# Patient Record
Sex: Female | Born: 1955 | Race: White | Hispanic: No | Marital: Married | Smoking: Former smoker
Health system: Southern US, Community
[De-identification: ages and names within clinical notes are randomized; demographics above are authoritative.]

## PROBLEM LIST (undated history)

## (undated) DIAGNOSIS — H9319 Tinnitus, unspecified ear: Secondary | ICD-10-CM

## (undated) DIAGNOSIS — C50919 Malignant neoplasm of unspecified site of unspecified female breast: Secondary | ICD-10-CM

## (undated) DIAGNOSIS — H9190 Unspecified hearing loss, unspecified ear: Secondary | ICD-10-CM

## (undated) DIAGNOSIS — A630 Anogenital (venereal) warts: Secondary | ICD-10-CM

## (undated) DIAGNOSIS — R002 Palpitations: Secondary | ICD-10-CM

## (undated) DIAGNOSIS — U071 COVID-19: Secondary | ICD-10-CM

## (undated) HISTORY — PX: COSMETIC SURGERY: SHX468

## (undated) HISTORY — PX: TUBAL LIGATION: SHX77

## (undated) HISTORY — DX: Palpitations: R00.2

## (undated) HISTORY — DX: Anogenital (venereal) warts: A63.0

## (undated) HISTORY — DX: Unspecified hearing loss, unspecified ear: H91.90

## (undated) HISTORY — DX: Malignant neoplasm of unspecified site of unspecified female breast: C50.919

## (undated) HISTORY — DX: Tinnitus, unspecified ear: H93.19

## (undated) HISTORY — DX: COVID-19: U07.1

---
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
## 2017-03-02 ENCOUNTER — Ambulatory Visit (INDEPENDENT_AMBULATORY_CARE_PROVIDER_SITE_OTHER): Payer: 59 | Admitting: Obstetrics and Gynecology

## 2017-03-02 ENCOUNTER — Other Ambulatory Visit (HOSPITAL_COMMUNITY)
Admission: RE | Admit: 2017-03-02 | Discharge: 2017-03-02 | Disposition: A | Discharge status: Home / Self Care | Payer: Managed Care, Other (non HMO) | Source: Ambulatory Visit | Attending: Obstetrics and Gynecology | Admitting: Obstetrics and Gynecology

## 2017-03-02 ENCOUNTER — Encounter: Payer: Self-pay | Admitting: Obstetrics and Gynecology

## 2017-03-02 VITALS — BP 102/62 | HR 72 | Resp 16 | Ht 63.75 in | Wt 149.0 lb

## 2017-03-02 DIAGNOSIS — Z23 Encounter for immunization: Secondary | ICD-10-CM

## 2017-03-02 DIAGNOSIS — N950 Postmenopausal bleeding: Secondary | ICD-10-CM | POA: Diagnosis not present

## 2017-03-02 DIAGNOSIS — Z01419 Encounter for gynecological examination (general) (routine) without abnormal findings: Secondary | ICD-10-CM | POA: Diagnosis not present

## 2017-03-02 DIAGNOSIS — Z79890 Hormone replacement therapy (postmenopausal): Secondary | ICD-10-CM

## 2017-03-02 DIAGNOSIS — Z124 Encounter for screening for malignant neoplasm of cervix: Secondary | ICD-10-CM | POA: Insufficient documentation

## 2017-03-02 MED ORDER — MISOPROSTOL 200 MCG PO TABS: ORAL_TABLET | ORAL | 0 refills | Status: DC

## 2017-03-02 NOTE — Progress Notes (Signed)
61 y.o. [id number] MarriedCaucasianF here for annual exam.  She denies vaginal bleeding. Sexually active no pain.  No vasomotor symptoms. She is on estrogen and testosterone pellets for the last few years. She notices an increase in energy, libido, weight loss with the testosterone. She has gotten cystic acne from the testosterone.     No LMP recorded (lmp unknown). Patient is postmenopausal.          Sexually active: Yes.    The current method of family planning is post menopausal status.    Exercising: Yes.    tennis, golf, walking Smoker:  Former smoker years ago  Health Maintenance: Pap:  2016 per patient done with Dr. Louretta Shorten in [place] -- normal per patient History of abnormal Pap:  no MMG:  05/26/16 BIRADS 1 negative -- in Care Everywhere  Colonoscopy:  Done with [facility] early 2018 - normal per patient -- repeat in 5 years BMD:   Few years ago per patient Osteopenia TDaP:  unsure Gardasil: n/a   reports that she has quit smoking. Her smoking use included Cigarettes. She has never used smokeless tobacco. She reports that she drinks about 2.4 - 3.0 oz of alcohol per week . She reports that she does not use drugs.She has daughter who is 72, in [place], Anesthesia resident.  Retired.   Past Medical History:  Diagnosis Date  . Genital warts     Past Surgical History:  Procedure Laterality Date  . CESAREAN SECTION    . COSMETIC SURGERY    . [place]  . TUBAL LIGATION      Current Outpatient Prescriptions  Medication Sig Dispense Refill  . Multiple Vitamin (MULTIVITAMIN) tablet Take 1 tablet by mouth daily.    . Naproxen Sodium 220 MG CAPS Take by mouth as needed.    . NON FORMULARY Estrogen pllt; one implant every 3 months    . Omega-3 Fatty Acids (OMEGA-3 FISH OIL PO) Take by mouth daily.    Marland Kitchen PROGESTERONE MICRONIZED PO Take 100 mg by mouth daily. Take 150mg  po daily    . spironolactone (ALDACTONE) 50 MG tablet Take 50 mg by mouth  daily.    . Testosterone 75 MG PLLT by Implant route every 3 (three) months.     No current facility-administered medications for this visit.     Family History  Problem Relation Age of Onset  . Arthritis Father     Review of Systems  Constitutional: Negative.   HENT: Negative.   Eyes: Negative.   Respiratory: Negative.   Cardiovascular: Negative.   Gastrointestinal: Negative.   Endocrine: Negative.   Genitourinary: Negative.   Musculoskeletal: Negative.   Skin: Negative.   Allergic/Immunologic: Negative.   Neurological: Negative.   Hematological: Negative.   Psychiatric/Behavioral: Negative.     Exam:   BP 102/62 (BP Location: Right Arm, Patient Position: Sitting, Cuff Size: Normal)   Pulse 72   Resp 16   Ht 5' 3.75" (1.619 m)   Wt 149 lb (67.6 kg)   LMP  (LMP Unknown)   BMI 25.78 kg/m   Weight change: @WEIGHTCHANGE @ Height:   Height: 5' 3.75" (161.9 cm)  Ht Readings from Last 3 Encounters:  03/02/17 5' 3.75" (1.619 m)    General appearance: alert, cooperative and appears stated age Head: Normocephalic, without obvious abnormality, atraumatic Neck: no adenopathy, supple, symmetrical, trachea midline and thyroid normal to inspection and palpation Lungs: clear to auscultation bilaterally Cardiovascular: regular rate and rhythm Breasts: normal appearance,  no masses or tenderness Abdomen: soft, non-tender; bowel sounds normal; no masses,  no organomegaly Extremities: extremities normal, atraumatic, no cyanosis or edema Skin: Skin color, texture, turgor normal. No rashes or lesions Lymph nodes: Cervical, supraclavicular, and axillary nodes normal. No abnormal inguinal nodes palpated Neurologic: Grossly normal   Pelvic: External genitalia:  no lesions              Urethra:  normal appearing urethra with no masses, tenderness or lesions              Bartholins and Skenes: normal                 Vagina: normal appearing vagina with a brown d/c (hemacult +)               Cervix: no lesions, stenotic               Bimanual Exam:  Uterus:  normal size, contour, position, consistency, mobility, non-tender              Adnexa: no mass, fullness, tenderness               Rectovaginal: Confirms               Anus:  normal sphincter tone, no lesions  Chaperone was present for exam.  A:  Well Woman with normal exam  On HRT estrogen and testosterone pellets and prometrium. Needs spironolactone for the cystic acne caused from the pellets.   PMP bleeding. Brown d/c in her vagina. On questioning she c/o a several day history brown d/c    P:   Pap with hpv  Labs with primary, needs to establish care  Return for gyn ultrasound, possible sonohysterogram, possible endometrial biopsy  Pre-treat with cytotec  Discussed my concerns with hormone pellets, she only gets levels checked every 3 months (prior to getting another pellet). Discussed risks of HRT in general and testosterone. I'm concerned with her cystic acne that her testosterone levels are too high (she declines blood work today)  Discussed that there is no FDA approved testosterone for women, discussed risk of having testosterone levels outside of the range for a normal female. Discussed the risks of unopposed estrogen (she is on prometrium) and again the concerns with pellets.   After her exam she states that in the last year her estrogen pellet dose was increased, she had some bleeding and they decreased her dose. No evaluation was done.

## 2017-03-02 NOTE — Patient Instructions (Signed)

## 2017-03-04 ENCOUNTER — Telehealth: Payer: Self-pay | Admitting: Obstetrics and Gynecology

## 2017-03-04 NOTE — Telephone Encounter (Signed)
Called patient to review benefits for Sonohystogram and Endometrial Biopsy. Left voicemail to call back and review.

## 2017-03-07 LAB — CYTOLOGY - PAP
DIAGNOSIS: NEGATIVE
HPV (WINDOPATH): NOT DETECTED

## 2017-03-07 NOTE — Telephone Encounter (Signed)
Patient returned call. Reviewed benefit for all procedures. Offered next available appointment. Patient declined. Patient requested to schedule 04-05-17. Patient scheduled with Dr. Talbert Nan @ 1pm on 04-05-17.   Patient agreeable to return call from nurse if changes required to appointment.  Routing to provider for review.

## 2017-03-16 ENCOUNTER — Other Ambulatory Visit: Payer: Self-pay | Admitting: *Deleted

## 2017-03-16 DIAGNOSIS — N950 Postmenopausal bleeding: Secondary | ICD-10-CM

## 2017-03-31 ENCOUNTER — Telehealth: Payer: Self-pay | Admitting: Obstetrics and Gynecology

## 2017-03-31 NOTE — Telephone Encounter (Signed)
Spoke with patient. SHGM and possible EMB rescheduled to 04/26/2017 at 1 pm with 1:30 pm consult with Dr.Jertson. Patient declines earlier appointments.  Routing to provider for final review. Patient agreeable to disposition. Will close encounter.

## 2017-03-31 NOTE — Telephone Encounter (Signed)
Patient called to cancel/reschedule her ultrasound for 04/05/17 due to incoming inclement weather. Routing call to [facility] for rescheduling.

## 2017-04-05 ENCOUNTER — Other Ambulatory Visit: Payer: 59 | Admitting: Obstetrics and Gynecology

## 2017-04-05 ENCOUNTER — Other Ambulatory Visit: Payer: 59

## 2017-04-26 ENCOUNTER — Telehealth: Payer: Self-pay | Admitting: Obstetrics and Gynecology

## 2017-04-26 ENCOUNTER — Ambulatory Visit: Payer: 59

## 2017-04-26 ENCOUNTER — Other Ambulatory Visit: Payer: 59 | Admitting: Obstetrics and Gynecology

## 2017-04-26 NOTE — Telephone Encounter (Addendum)
Patient came in for an ultrasound (SHGM/EMB) today but cancelled due to her wait time. She said she will call back to reschedule with her schedule. The patient said she will also need a refill on the medication she took today prior to her appointment.  Routing to triage, Deloris Ping and Dr. Talbert Nan

## 2017-05-05 MED ORDER — MISOPROSTOL 200 MCG PO TABS: ORAL_TABLET | ORAL | 0 refills | Status: DC

## 2017-05-05 NOTE — Telephone Encounter (Signed)
Spoke with patient. SHGM with possible EMB rescheduled to 06/02/2017 at 9:30 am with 10 am consult with Dr.Jertson. Patient declines all earlier appointments offered due to her schedule. Rx for Cytotec 200 mcg place 2 tablets 6-12 hours prior to appointment #2 0RF sent to pharmacy on file. Patient verbalizes understanding.  Routing to provider for final review. Patient agreeable to disposition. Will close encounter.

## 2017-05-31 ENCOUNTER — Telehealth: Payer: Self-pay | Admitting: Obstetrics and Gynecology

## 2017-05-31 NOTE — Telephone Encounter (Signed)
Patient called on 05/30/17 to verify benefits for appointment scheduled on 06/02/17.  I advised patient, benefits were pre-certified in August and I would like to call to pre-cert again, since it has been  over three months.  Patient is agreeable.  Call placed to patient to review re-verified benefits for scheduled appointment.  Left voicemail message requesting a return call to review information.    cc: Lamont Snowball, RN

## 2017-05-31 NOTE — Telephone Encounter (Signed)
Patient returned call. Provided benefit information for scheduled appointment on 06/02/17. Patient understood information presented. Patient requested to cancel appointment, stating she would like to "hold off until the first of the year".  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  Advised patient I will advise her doctor of her decision.  Routing to Dr Talbert Nan  cc: Lamont Snowball, RN

## 2017-06-01 NOTE — Telephone Encounter (Signed)
Please speak with the patient. 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 Even if she can't go through with the ultrasound yet, see if she is willing to come in for the endometrial biopsy. See if there is any way we can help her. Thanks.

## 2017-06-02 ENCOUNTER — Other Ambulatory Visit: Payer: 59 | Admitting: Obstetrics and Gynecology

## 2017-06-02 ENCOUNTER — Other Ambulatory Visit: Payer: 59

## 2017-06-14 NOTE — Telephone Encounter (Signed)
Call to patient. Per DPR,can leave message on voice mail and message has number confirmation.  Left message calling to review importance of proceeding with recommended evaluation. Left message to call back.

## 2017-06-20 NOTE — Telephone Encounter (Signed)
Call to patient. Left message on voice mail (ok per ROI) that we are calling to discuss options regarding evaluation of PMB. Advised Dr Talbert Nan encourages her to follow-thru with evaluation. Can come back in to discuss if needed, could start with endo biopsy. Requested call back to nursing supervisor, Gay Filler, to discuss options.

## 2017-06-21 NOTE — Telephone Encounter (Signed)
Call to patient X 2 and detailed message left.  No patient response. Please advise.

## 2017-06-22 NOTE — Telephone Encounter (Signed)
Please send a letter and then close the encounter

## 2017-06-23 NOTE — Telephone Encounter (Signed)
Patient returned call. Advised Dr Talbert Nan highly encourages proceeding with evaluation of PMB. Can proceed with endometrial biopsy as a first step to sample lining of endometrium for abnormal cells. Patient states she understands we are doing our job and she will consider this option but not until after first of January. Offered payment arrangements and patient still declines. Again, advised patient DR Talbert Nan is trying to rule out abnormal cells up to and including cancer cells and delay in diagnosis can result in the need for more aggressive treatment. Patient states she is aware of this and will call back in January.   Routing to provider for final review. Patient agreeable to disposition. Will close encounter.

## 2017-08-01 ENCOUNTER — Telehealth: Payer: Self-pay | Admitting: Obstetrics and Gynecology

## 2017-08-01 NOTE — Telephone Encounter (Signed)
Spoke with patient. Patient requesting OV for consult to discuss recommended EMB and SHGM for PMB. OV scheduled for 1/23 at 8:30am, declined appt offered for 1/16. Patient is agreeable to date and time.   Routing to provider for final review. Patient is agreeable to disposition. Will close encounter.

## 2017-08-01 NOTE — Telephone Encounter (Signed)
Patient returning call.

## 2017-08-01 NOTE — Telephone Encounter (Signed)
Patient is asking for an appointment to see Dr.Jertson for some abnormal bleeding.

## 2017-08-01 NOTE — Telephone Encounter (Signed)
Left message to call [name] at [phone].  

## 2017-08-10 ENCOUNTER — Other Ambulatory Visit: Payer: Self-pay

## 2017-08-10 ENCOUNTER — Encounter: Payer: Self-pay | Admitting: Obstetrics and Gynecology

## 2017-08-10 ENCOUNTER — Telehealth: Payer: Self-pay | Admitting: Obstetrics and Gynecology

## 2017-08-10 ENCOUNTER — Ambulatory Visit: Payer: Commercial Managed Care - PPO | Admitting: Obstetrics and Gynecology

## 2017-08-10 VITALS — BP 122/60 | HR 80 | Resp 16 | Wt 130.0 lb

## 2017-08-10 DIAGNOSIS — N950 Postmenopausal bleeding: Secondary | ICD-10-CM | POA: Diagnosis not present

## 2017-08-10 NOTE — Progress Notes (Signed)
GYNECOLOGY  VISIT   HPI: 62 y.o.   Married  Caucasian  female   [phone] with No LMP recorded (lmp unknown). Patient is postmenopausal.   here for consult to discuss PMB. At the time of her annual exam in 8/18 she was noted to have brown d/c in her vagina, on questioning she reported a several day h/o brown d/c. Evaluation was recommended, the patient has postponed evaluation and is now here to discuss the recommendations.  The patient is on estrogen and testosterone pellets and prometrium. In the last 1.5 years she had bleeding after an increase in her estrogen pellet, then the dose was decreased. She is 3 months from her last estrogen pellets, isn't  GYNECOLOGIC HISTORY: No LMP recorded (lmp unknown). Patient is postmenopausal. Contraception:postmenopause  Menopausal hormone therapy: none         OB History    Gravida Para Term Preterm AB Living   4 2 1   2 1    SAB TAB Ectopic Multiple Live Births           1         There are no active problems to display for this patient.   Past Medical History:  Diagnosis Date  . Genital warts     Past Surgical History:  Procedure Laterality Date  . CESAREAN SECTION    . COSMETIC SURGERY    . [place]  . TUBAL LIGATION      Current Outpatient Medications  Medication Sig Dispense Refill  . Multiple Vitamin (MULTIVITAMIN) tablet Take 1 tablet by mouth daily.    . Naproxen Sodium 220 MG CAPS Take by mouth as needed.    . Omega-3 Fatty Acids (OMEGA-3 FISH OIL PO) Take by mouth daily.    Marland Kitchen PROGESTERONE MICRONIZED PO Take 100 mg by mouth daily. Take 150mg  po daily    . spironolactone (ALDACTONE) 50 MG tablet Take 50 mg by mouth daily.     No current facility-administered medications for this visit.      ALLERGIES: Nitrofurantoin macrocrystal  Family History  Problem Relation Age of Onset  . Arthritis Father     Social History   Socioeconomic History  . Marital status: Married    Spouse name: Not on  file  . Number of children: Not on file  . Years of education: Not on file  . Highest education level: Not on file  Social Needs  . Financial resource strain: Not on file  . Food insecurity - worry: Not on file  . Food insecurity - inability: Not on file  . Transportation needs - medical: Not on file  . Transportation needs - non-medical: Not on file  Occupational History  . Not on file  Tobacco Use  . Smoking status: Former Smoker    Types: Cigarettes  . Smokeless tobacco: Never Used  Substance and Sexual Activity  . Alcohol use: Yes    Alcohol/week: 2.4 - 3.0 oz    Types: 4 - 5 Glasses of wine per week  . Drug use: No  . Sexual activity: Yes    Birth control/protection: Post-menopausal  Other Topics Concern  . Not on file  Social History Narrative  . Not on file    Review of Systems  Constitutional: Negative.   HENT: Negative.   Eyes: Negative.   Respiratory: Negative.   Cardiovascular: Negative.   Gastrointestinal: Negative.   Genitourinary: Negative.   Musculoskeletal: Negative.   Skin: Negative.   Neurological: Negative.  Endo/Heme/Allergies: Negative.   Psychiatric/Behavioral: Negative.     PHYSICAL EXAMINATION:    BP 122/60 (BP Location: Right Arm, Patient Position: Sitting, Cuff Size: Normal)   Pulse 80   Resp 16   Wt 130 lb (59 kg)   LMP  (LMP Unknown)   BMI 22.49 kg/m     General appearance: alert, cooperative and appears stated age  ASSESSMENT Postmenopausal bleeding on HRT (pellets and prometrium) H/o cervical stenosis    PLAN Discussed reasoning for evaluation and process of evaluation Will start with an ultrasound, if her lining is normal and <4 mm no further evaluation needed unless she continues to bleed. If lining over 4 mm will proceed with sonohysterogram and possible endometrial biopsy Will pre-treat with cytotec (she has the script) She is stopping the estrogen and testosterone pellets and will let me know if she is having  significant side effects She would like vaginal estrogen If her w/u is negative, will call in generic vagifem at her next appointment.    An After Visit Summary was printed and given to the patient.  ~10 minutes face to face time of which over 50% was spent in counseling.

## 2017-08-10 NOTE — Telephone Encounter (Signed)
Spoke with patient regarding benefit for an ultrasound with a possible sonohysterogram and endometrial biopsy (see account notes for details). Patient understood and agreeable. Patient ready to schedule. Patient scheduled 08/30/17 with Dr Talbert Nan.  Patient declined ealier appointment dates offered. Patient is aware of appointment date, arrival time and cancellation policy. No further questions. Ok to close   cc: Dr Talbert Nan

## 2017-08-30 ENCOUNTER — Ambulatory Visit (INDEPENDENT_AMBULATORY_CARE_PROVIDER_SITE_OTHER): Payer: Commercial Managed Care - PPO | Admitting: Obstetrics and Gynecology

## 2017-08-30 ENCOUNTER — Encounter: Payer: Self-pay | Admitting: Obstetrics and Gynecology

## 2017-08-30 ENCOUNTER — Other Ambulatory Visit: Payer: Self-pay | Admitting: Obstetrics and Gynecology

## 2017-08-30 ENCOUNTER — Ambulatory Visit (INDEPENDENT_AMBULATORY_CARE_PROVIDER_SITE_OTHER): Payer: Commercial Managed Care - PPO

## 2017-08-30 ENCOUNTER — Other Ambulatory Visit: Payer: Self-pay

## 2017-08-30 VITALS — BP 110/60 | HR 64 | Resp 12 | Wt 135.0 lb

## 2017-08-30 DIAGNOSIS — N950 Postmenopausal bleeding: Secondary | ICD-10-CM | POA: Diagnosis not present

## 2017-08-30 DIAGNOSIS — N952 Postmenopausal atrophic vaginitis: Secondary | ICD-10-CM

## 2017-08-30 MED ORDER — ESTRADIOL 10 MCG VA TABS: 1.0000 | ORAL_TABLET | VAGINAL | 1 refills | Status: DC

## 2017-08-30 NOTE — Progress Notes (Signed)
GYNECOLOGY  VISIT   HPI: 62 y.o.   Married  Caucasian  female   [phone] with No LMP recorded (lmp unknown). Patient is postmenopausal.   here for follow up PMB. She is stopping her estrogen and testosterone pellets (she was due for new ones last month). She hasn't had any bleeding for 2-3 weeks. She is having some night sweats, so far tolerable. Requests vaginal estrogen.   GYNECOLOGIC HISTORY: No LMP recorded (lmp unknown). Patient is postmenopausal. Contraception:postmenopause Menopausal hormone therapy: none         OB History    Gravida Para Term Preterm AB Living   4 2 1   2 1    SAB TAB Ectopic Multiple Live Births           1         There are no active problems to display for this patient.   Past Medical History:  Diagnosis Date  . Genital warts     Past Surgical History:  Procedure Laterality Date  . CESAREAN SECTION    . COSMETIC SURGERY    . [place]  . TUBAL LIGATION      Current Outpatient Medications  Medication Sig Dispense Refill  . Multiple Vitamin (MULTIVITAMIN) tablet Take 1 tablet by mouth daily.    . Naproxen Sodium 220 MG CAPS Take by mouth as needed.    . Omega-3 Fatty Acids (OMEGA-3 FISH OIL PO) Take by mouth daily.    Marland Kitchen spironolactone (ALDACTONE) 50 MG tablet Take 50 mg by mouth daily.     No current facility-administered medications for this visit.      ALLERGIES: Nitrofurantoin macrocrystal  Family History  Problem Relation Age of Onset  . Arthritis Father     Social History   Socioeconomic History  . Marital status: Married    Spouse name: Not on file  . Number of children: Not on file  . Years of education: Not on file  . Highest education level: Not on file  Social Needs  . Financial resource strain: Not on file  . Food insecurity - worry: Not on file  . Food insecurity - inability: Not on file  . Transportation needs - medical: Not on file  . Transportation needs - non-medical: Not on file   Occupational History  . Not on file  Tobacco Use  . Smoking status: Former Smoker    Types: Cigarettes  . Smokeless tobacco: Never Used  Substance and Sexual Activity  . Alcohol use: Yes    Alcohol/week: 2.4 - 3.0 oz    Types: 4 - 5 Glasses of wine per week  . Drug use: No  . Sexual activity: Yes    Birth control/protection: Post-menopausal  Other Topics Concern  . Not on file  Social History Narrative  . Not on file    Review of Systems  Constitutional: Negative.   HENT: Negative.   Eyes: Negative.   Respiratory: Negative.   Cardiovascular: Negative.   Gastrointestinal: Negative.   Genitourinary: Negative.   Musculoskeletal: Negative.   Skin: Negative.   Neurological: Negative.   Endo/Heme/Allergies: Negative.   Psychiatric/Behavioral: Negative.     PHYSICAL EXAMINATION:    BP 110/60 (BP Location: Right Arm, Patient Position: Sitting, Cuff Size: Normal)   Pulse 64   Resp 12   Wt 135 lb (61.2 kg)   LMP  (LMP Unknown)   BMI 23.35 kg/m     General appearance: alert, cooperative and appears stated age  Ultrasound  images reviewed with the patient  ASSESSMENT PMP bleeding on HRT, she has stopped the HRT. No bleeding for the last 2-3 weeks. Thin endometrium on ultrasound She requests vaginal estrogen    PLAN She will call with further bleeding. If she does have recurrent bleeding, would recommend endometrial biopsy (she has a stenotic os, would pre-treat with cytotec) Vaginal estrogen sent    An After Visit Summary was printed and given to the patient.

## 2018-08-18 DIAGNOSIS — Z20828 Contact with and (suspected) exposure to other viral communicable diseases: Secondary | ICD-10-CM | POA: Diagnosis not present

## 2018-09-26 DIAGNOSIS — L821 Other seborrheic keratosis: Secondary | ICD-10-CM | POA: Diagnosis not present

## 2018-09-26 DIAGNOSIS — I781 Nevus, non-neoplastic: Secondary | ICD-10-CM | POA: Diagnosis not present

## 2018-09-26 DIAGNOSIS — L570 Actinic keratosis: Secondary | ICD-10-CM | POA: Diagnosis not present

## 2018-09-26 DIAGNOSIS — Z79899 Other long term (current) drug therapy: Secondary | ICD-10-CM | POA: Diagnosis not present

## 2018-09-26 DIAGNOSIS — L700 Acne vulgaris: Secondary | ICD-10-CM | POA: Diagnosis not present

## 2018-09-26 DIAGNOSIS — L814 Other melanin hyperpigmentation: Secondary | ICD-10-CM | POA: Diagnosis not present

## 2018-12-13 ENCOUNTER — Other Ambulatory Visit: Payer: Self-pay

## 2018-12-13 NOTE — Progress Notes (Signed)
63 y.o. [id number] Married White or Caucasian Not Hispanic or Latino female here for annual exam and vaginal dryness.   She was previously on generic vagifem, wasn't working for her.  Only notices the dryness when trying to be sexually active, lubricant helps some.  No vaginal bleeding.     No LMP recorded (lmp unknown). Patient is postmenopausal.          Sexually active: Yes.    The current method of family planning is post menopausal status.    Exercising: Yes.    walk, tennis, golf Smoker:  no  Health Maintenance: Pap:  03/02/2017 WNL History of abnormal Pap:  no MMG:  05/26/16 BIRADS 1 negative -- in Care Everywhere  Colonoscopy:  Done with [facility] early 2018 - normal per patient -- repeat in 5 years BMD:  2016 per patient Osteopenia TDaP:  03/02/2017 Gardasil: n/a   reports that she has quit smoking. Her smoking use included cigarettes. She has never used smokeless tobacco. She reports current alcohol use of about 4.0 standard drinks of alcohol per week. She reports that she does not use drugs. 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 Genital warts     Past Surgical History:  Procedure Laterality Date  . CESAREAN SECTION    . COSMETIC SURGERY    . [place]  . TUBAL LIGATION      Current Outpatient Medications  Medication Sig Dispense Refill  . Multiple Vitamin (MULTIVITAMIN) tablet Take 1 tablet by mouth daily.    . Naproxen Sodium 220 MG CAPS Take by mouth as needed.    . Omega-3 Fatty Acids (OMEGA-3 FISH OIL PO) Take by mouth daily.    Marland Kitchen spironolactone (ALDACTONE) 100 MG tablet Take 1 tablet by mouth daily.     No current facility-administered medications for this visit.     Family History  Problem Relation Age of Onset  . Arthritis Father     Review of Systems  Exam:   BP 124/78 (BP Location: Right Arm, Patient Position: Sitting, Cuff Size: Normal)   Pulse 80   Temp  98.1 F (36.7 C) (Skin)   Wt 132 lb 12.8 oz (60.2 kg)   LMP  (LMP Unknown)   BMI 22.97 kg/m   Weight change: @WEIGHTCHANGE @ Height:      Ht Readings from Last 3 Encounters:  03/02/17 5' 3.75" (1.619 m)    General appearance: alert, cooperative and appears stated age Head: Normocephalic, without obvious abnormality, atraumatic Neck: no adenopathy, supple, symmetrical, trachea midline and thyroid normal to inspection and palpation Lungs: clear to auscultation bilaterally Cardiovascular: regular rate and rhythm Breasts: normal appearance, no masses or tenderness Abdomen: soft, non-tender; non distended,  no masses,  no organomegaly Extremities: extremities normal, atraumatic, no cyanosis or edema Skin: Skin color, texture, turgor normal. No rashes or lesions Lymph nodes: Cervical, supraclavicular, and axillary nodes normal. No abnormal inguinal nodes palpated Neurologic: Grossly normal   Pelvic: External genitalia:  no lesions              Urethra:  normal appearing urethra with no masses, tenderness or lesions              Bartholins and Skenes: normal                 Vagina: atrophic appearing vagina with normal color and discharge, no lesions  Cervix: no lesions               Bimanual Exam:  Uterus:  normal size, contour, position, consistency, mobility, non-tender              Adnexa: no mass, fullness, tenderness               Rectovaginal: Confirms               Anus:  normal sphincter tone, no lesions  Chaperone was present for exam.  A:  Well Woman with normal exam  Vaginal atrophy  H/O osteopenia  P:   No pap this year  Discussed breast self exam  Discussed calcium and vit D intake  Mammogram overdue, she will schedule  DEXA  Colonoscopy UTD  Start Estrace cream (she will call if it is too expensive). As long as her mammogram is normal I will refill her estrace

## 2018-12-14 ENCOUNTER — Other Ambulatory Visit: Payer: Self-pay

## 2018-12-14 ENCOUNTER — Encounter: Payer: Self-pay | Admitting: Obstetrics and Gynecology

## 2018-12-14 ENCOUNTER — Ambulatory Visit: Payer: BLUE CROSS/BLUE SHIELD | Admitting: Obstetrics and Gynecology

## 2018-12-14 ENCOUNTER — Other Ambulatory Visit: Payer: Self-pay | Admitting: Obstetrics and Gynecology

## 2018-12-14 VITALS — BP 124/78 | HR 80 | Temp 98.1°F | Wt 132.8 lb

## 2018-12-14 DIAGNOSIS — Z1231 Encounter for screening mammogram for malignant neoplasm of breast: Secondary | ICD-10-CM

## 2018-12-14 DIAGNOSIS — Z0000 Encounter for general adult medical examination without abnormal findings: Secondary | ICD-10-CM

## 2018-12-14 DIAGNOSIS — N952 Postmenopausal atrophic vaginitis: Secondary | ICD-10-CM | POA: Diagnosis not present

## 2018-12-14 DIAGNOSIS — Z01419 Encounter for gynecological examination (general) (routine) without abnormal findings: Secondary | ICD-10-CM | POA: Diagnosis not present

## 2018-12-14 DIAGNOSIS — E2839 Other primary ovarian failure: Secondary | ICD-10-CM

## 2018-12-14 DIAGNOSIS — Z8739 Personal history of other diseases of the musculoskeletal system and connective tissue: Secondary | ICD-10-CM

## 2018-12-14 MED ORDER — ESTRADIOL 0.1 MG/GM VA CREA: TOPICAL_CREAM | VAGINAL | 0 refills | Status: DC

## 2018-12-14 NOTE — Patient Instructions (Signed)

## 2018-12-15 LAB — COMPREHENSIVE METABOLIC PANEL
ALT: 14 IU/L (ref 0–32)
AST: 17 IU/L (ref 0–40)
Albumin/Globulin Ratio: 2.5 — ABNORMAL HIGH (ref 1.2–2.2)
Albumin: 4.5 g/dL (ref 3.8–4.8)
Alkaline Phosphatase: 75 IU/L (ref 39–117)
BUN/Creatinine Ratio: 20 (ref 12–28)
BUN: 20 mg/dL (ref 8–27)
Bilirubin Total: 0.4 mg/dL (ref 0.0–1.2)
CO2: 27 mmol/L (ref 20–29)
Calcium: 9.4 mg/dL (ref 8.7–10.3)
Chloride: 101 mmol/L (ref 96–106)
Creatinine, Ser: 0.98 mg/dL (ref 0.57–1.00)
GFR calc Af Amer: 71 mL/min/{1.73_m2} (ref >59)
GFR calc non Af Amer: 62 mL/min/{1.73_m2} (ref >59)
Globulin, Total: 1.8 g/dL (ref 1.5–4.5)
Glucose: 98 mg/dL (ref 65–99)
Potassium: 4.6 mmol/L (ref 3.5–5.2)
Sodium: 141 mmol/L (ref 134–144)
Total Protein: 6.3 g/dL (ref 6.0–8.5)

## 2018-12-15 LAB — CBC
Hematocrit: 44.7 % (ref 34.0–46.6)
Hemoglobin: 14.7 g/dL (ref 11.1–15.9)
MCH: 30.4 pg (ref 26.6–33.0)
MCHC: 32.9 g/dL (ref 31.5–35.7)
MCV: 92 fL (ref 79–97)
Platelets: 293 10*3/uL (ref 150–450)
RBC: 4.84 x10E6/uL (ref 3.77–5.28)
RDW: 11.8 % (ref 11.7–15.4)
WBC: 4.8 10*3/uL (ref 3.4–10.8)

## 2018-12-15 LAB — LIPID PANEL
Chol/HDL Ratio: 2.7 ratio (ref 0.0–4.4)
Cholesterol, Total: 263 mg/dL — ABNORMAL HIGH (ref 100–199)
HDL: 98 mg/dL (ref >39)
LDL Calculated: 154 mg/dL — ABNORMAL HIGH (ref 0–99)
Triglycerides: 54 mg/dL (ref 0–149)
VLDL Cholesterol Cal: 11 mg/dL (ref 5–40)

## 2019-01-03 DIAGNOSIS — L700 Acne vulgaris: Secondary | ICD-10-CM | POA: Diagnosis not present

## 2019-01-03 DIAGNOSIS — Z79899 Other long term (current) drug therapy: Secondary | ICD-10-CM | POA: Diagnosis not present

## 2019-01-04 DIAGNOSIS — L700 Acne vulgaris: Secondary | ICD-10-CM | POA: Diagnosis not present

## 2019-01-04 DIAGNOSIS — Z79899 Other long term (current) drug therapy: Secondary | ICD-10-CM | POA: Diagnosis not present

## 2019-02-28 ENCOUNTER — Other Ambulatory Visit: Payer: Self-pay | Admitting: Obstetrics and Gynecology

## 2019-02-28 DIAGNOSIS — E2839 Other primary ovarian failure: Secondary | ICD-10-CM

## 2019-02-28 DIAGNOSIS — Z8739 Personal history of other diseases of the musculoskeletal system and connective tissue: Secondary | ICD-10-CM

## 2019-03-01 ENCOUNTER — Other Ambulatory Visit: Payer: Managed Care, Other (non HMO)

## 2019-03-01 ENCOUNTER — Ambulatory Visit: Payer: Managed Care, Other (non HMO)

## 2019-03-05 ENCOUNTER — Other Ambulatory Visit: Payer: Self-pay

## 2019-03-05 NOTE — Telephone Encounter (Signed)
Medication refill request:Estrace 0.1 vaginal  cream Last AEX:  12-14-18 Next AEX: 12-19-19 Last MMG (if hormonal medication request): Scheduled 04-12-19 Refill authorized: Please advise

## 2019-03-06 MED ORDER — ESTRADIOL 0.1 MG/GM VA CREA: TOPICAL_CREAM | VAGINAL | 0 refills | Status: DC

## 2019-04-12 ENCOUNTER — Ambulatory Visit
Admission: RE | Admit: 2019-04-12 | Discharge: 2019-04-12 | Disposition: A | Discharge status: Home / Self Care | Payer: BC Managed Care – PPO | Source: Ambulatory Visit | Attending: Obstetrics and Gynecology | Admitting: Obstetrics and Gynecology

## 2019-04-12 ENCOUNTER — Other Ambulatory Visit: Payer: Self-pay

## 2019-04-12 DIAGNOSIS — Z1231 Encounter for screening mammogram for malignant neoplasm of breast: Secondary | ICD-10-CM | POA: Diagnosis not present

## 2019-04-30 ENCOUNTER — Telehealth: Payer: Self-pay | Admitting: Obstetrics and Gynecology

## 2019-04-30 MED ORDER — ESTRADIOL 0.1 MG/GM VA CREA: TOPICAL_CREAM | VAGINAL | 1 refills | Status: DC

## 2019-04-30 NOTE — Telephone Encounter (Signed)
Medication refill request: estrace  Last AEX:  12/14/18 Next AEX: 12/19/19 Last MMG (if hormonal medication request): 04/12/19 birads 1 neg  Refill authorized: 42.5g with 1 RF

## 2019-04-30 NOTE — Telephone Encounter (Signed)
Patient requesting refill of estrace cream. [facility] at [phone].

## 2019-04-30 NOTE — Telephone Encounter (Signed)
Called patient left voicemail for call back to [place] at [phone]

## 2019-04-30 NOTE — Telephone Encounter (Signed)
I've refilled her estrace, but please check how much she is using. 1 tube should last over 5 months and her last one was refilled in August. Just 1 gram 2 x a week

## 2019-04-30 NOTE — Telephone Encounter (Signed)
Spoke with patient she states that she was using more than one gram. She verbalized understanding of directions.

## 2019-05-03 NOTE — Telephone Encounter (Signed)
Can this encounter be closed?

## 2019-05-11 ENCOUNTER — Other Ambulatory Visit: Payer: Self-pay

## 2019-05-11 ENCOUNTER — Ambulatory Visit
Admission: RE | Admit: 2019-05-11 | Discharge: 2019-05-11 | Disposition: A | Discharge status: Home / Self Care | Payer: Managed Care, Other (non HMO) | Source: Ambulatory Visit | Attending: Obstetrics and Gynecology | Admitting: Obstetrics and Gynecology

## 2019-05-11 DIAGNOSIS — Z780 Asymptomatic menopausal state: Secondary | ICD-10-CM | POA: Diagnosis not present

## 2019-05-11 DIAGNOSIS — Z8739 Personal history of other diseases of the musculoskeletal system and connective tissue: Secondary | ICD-10-CM

## 2019-05-11 DIAGNOSIS — M8589 Other specified disorders of bone density and structure, multiple sites: Secondary | ICD-10-CM | POA: Diagnosis not present

## 2019-05-11 DIAGNOSIS — E2839 Other primary ovarian failure: Secondary | ICD-10-CM

## 2019-08-02 DIAGNOSIS — Z79899 Other long term (current) drug therapy: Secondary | ICD-10-CM | POA: Diagnosis not present

## 2019-08-02 DIAGNOSIS — L700 Acne vulgaris: Secondary | ICD-10-CM | POA: Diagnosis not present

## 2019-08-02 DIAGNOSIS — D225 Melanocytic nevi of trunk: Secondary | ICD-10-CM | POA: Diagnosis not present

## 2019-08-02 DIAGNOSIS — L570 Actinic keratosis: Secondary | ICD-10-CM | POA: Diagnosis not present

## 2019-09-22 DIAGNOSIS — Z23 Encounter for immunization: Secondary | ICD-10-CM | POA: Diagnosis not present

## 2019-09-22 DIAGNOSIS — Z79899 Other long term (current) drug therapy: Secondary | ICD-10-CM | POA: Diagnosis not present

## 2019-09-22 DIAGNOSIS — L309 Dermatitis, unspecified: Secondary | ICD-10-CM | POA: Diagnosis not present

## 2019-09-22 DIAGNOSIS — L570 Actinic keratosis: Secondary | ICD-10-CM | POA: Diagnosis not present

## 2019-10-26 DIAGNOSIS — Z23 Encounter for immunization: Secondary | ICD-10-CM | POA: Diagnosis not present

## 2019-11-12 ENCOUNTER — Telehealth: Payer: Self-pay | Admitting: Obstetrics and Gynecology

## 2019-11-12 NOTE — Telephone Encounter (Signed)
AEX 12/19/19 Hx acne- on doxycycline  Hx genital warts Hx tubal ligation C/S x 1 PMP- on Estrace    Spoke with pt. Pt reports noticing possible hernia on upper right side of pubic area/leg crease x  2 days now. Pt states "buldge" comes and goes. Notices it more when exercising. Went to gym today, did not do abd exercises.  Pt denies pain, fever, nausea or vomiting or any vaginal bleeding.   Pt advised to be seen for further evaluation. Pt agreeable. Pt scheduled for OV on 11/14/19 with Dr Talbert Nan at [facility]. Pt declines earlier appt. Pt verbalized understanding of date and time. CPS neg. Pt given ER precautions between now and OV. Pt agreeable.   Routing to Dr Talbert Nan  Encounter closed.

## 2019-11-12 NOTE — Telephone Encounter (Signed)
Patient believes she may have an inguinal hernia and would like an appointment with Dr.Jertson.

## 2019-11-13 ENCOUNTER — Other Ambulatory Visit: Payer: Self-pay

## 2019-11-14 ENCOUNTER — Ambulatory Visit: Payer: BC Managed Care – PPO | Admitting: Obstetrics and Gynecology

## 2019-11-14 ENCOUNTER — Encounter: Payer: Self-pay | Admitting: Obstetrics and Gynecology

## 2019-11-14 VITALS — BP 122/66 | HR 59 | Temp 97.9°F | Ht 64.0 in | Wt 131.0 lb

## 2019-11-14 DIAGNOSIS — K4090 Unilateral inguinal hernia, without obstruction or gangrene, not specified as recurrent: Secondary | ICD-10-CM

## 2019-11-14 NOTE — Patient Instructions (Signed)

## 2019-11-14 NOTE — Progress Notes (Signed)
GYNECOLOGY  VISIT   HPI: 64 y.o.   Married White or Caucasian Not Hispanic or Latino  female   [phone] with No LMP recorded (lmp unknown). Patient is postmenopausal.   here for possible Inguinal hernia on right groin. Patient states that it comes and goes. She noticed 1-2 weeks ago. Not tender. She has a sensation that there is something there.  She is taking doxycyline for cystic acne currently but can not remember the dosage.   GYNECOLOGIC HISTORY: No LMP recorded (lmp unknown). Patient is postmenopausal. Contraception:none  Menopausal hormone therapy: estradiol cream.        OB History    Gravida  4   Para  2   Term  1   Preterm      AB  2   Living  1     SAB      TAB      Ectopic      Multiple      Live Births  1              There are no problems to display for this patient.   Past Medical History:  Diagnosis Date  . Genital warts     Past Surgical History:  Procedure Laterality Date  . CESAREAN SECTION    . COSMETIC SURGERY    . [place]  . TUBAL LIGATION      Current Outpatient Medications  Medication Sig Dispense Refill  . estradiol (ESTRACE) 0.1 MG/GM vaginal cream Use 1 gram vaginally twice weekly at bedtime 42.5 g 1  . Multiple Vitamin (MULTIVITAMIN) tablet Take 1 tablet by mouth daily.    . Naproxen Sodium 220 MG CAPS Take by mouth as needed.    . Omega-3 Fatty Acids (OMEGA-3 FISH OIL PO) Take by mouth daily.    Marland Kitchen spironolactone (ALDACTONE) 100 MG tablet Take 1 tablet by mouth daily.     No current facility-administered medications for this visit.     ALLERGIES: Nitrofurantoin macrocrystal  Family History  Problem Relation Age of Onset  . Arthritis Father     Social History   Socioeconomic History  . Marital status: Married    Spouse name: Not on file  . Number of children: Not on file  . Years of education: Not on file  . Highest education level: Not on file  Occupational History  . Not on  file  Tobacco Use  . Smoking status: Former Smoker    Types: Cigarettes  . Smokeless tobacco: Never Used  Substance and Sexual Activity  . Alcohol use: Yes    Alcohol/week: 4.0 standard drinks    Types: 4 Glasses of wine per week  . Drug use: No  . Sexual activity: Yes    Birth control/protection: Post-menopausal  Other Topics Concern  . Not on file  Social History Narrative  . Not on file   Social Determinants of Health   Financial Resource Strain:   . Difficulty of Paying Living Expenses:   Food Insecurity:   . Worried About Charity fundraiser in the Last Year:   . Arboriculturist in the Last Year:   Transportation Needs:   . Film/video editor (Medical):   Marland Kitchen Lack of Transportation (Non-Medical):   Physical Activity:   . Days of Exercise per Week:   . Minutes of Exercise per Session:   Stress:   . Feeling of Stress :   Social Connections:   . Frequency of  Communication with Friends and Family:   . Frequency of Social Gatherings with Friends and Family:   . Attends Religious Services:   . Active Member of Clubs or Organizations:   . Attends Archivist Meetings:   Marland Kitchen Marital Status:   Intimate Partner Violence:   . Fear of Current or Ex-Partner:   . Emotionally Abused:   Marland Kitchen Physically Abused:   . Sexually Abused:     Review of Systems  All other systems reviewed and are negative.   PHYSICAL EXAMINATION:    BP 122/66   Pulse (!) 59   Temp 97.9 F (36.6 C)   Ht 5\' 4"  (1.626 m)   Wt 131 lb (59.4 kg)   LMP  (LMP Unknown)   SpO2 99%   BMI 22.49 kg/m     General appearance: alert, cooperative and appears stated age Right groin: slight tender bulge noted when patient was standing.   Reviewed picture where her right groin is clearly more swollen than her left.   ASSESSMENT Suspected right inguinal hernia    PLAN Referral placed to General Surgery. She requests to see Dr Florene Glen at [facility].

## 2019-11-15 ENCOUNTER — Telehealth: Payer: Self-pay | Admitting: Obstetrics and Gynecology

## 2019-11-15 NOTE — Telephone Encounter (Signed)
Call placed to convey referral details for scheduled appointment. Spoke with the patient she is aware the appointment is scheduled for 11/26/19 with Dr. Florene Glen.    [facility] [place], [place] [address] [phone] phone [phone]

## 2019-11-26 DIAGNOSIS — K4090 Unilateral inguinal hernia, without obstruction or gangrene, not specified as recurrent: Secondary | ICD-10-CM | POA: Diagnosis not present

## 2019-12-14 DIAGNOSIS — L570 Actinic keratosis: Secondary | ICD-10-CM | POA: Diagnosis not present

## 2019-12-14 DIAGNOSIS — L309 Dermatitis, unspecified: Secondary | ICD-10-CM | POA: Diagnosis not present

## 2019-12-14 DIAGNOSIS — Z79899 Other long term (current) drug therapy: Secondary | ICD-10-CM | POA: Diagnosis not present

## 2019-12-18 ENCOUNTER — Other Ambulatory Visit: Payer: Self-pay

## 2019-12-18 DIAGNOSIS — H9192 Unspecified hearing loss, left ear: Secondary | ICD-10-CM | POA: Insufficient documentation

## 2019-12-18 DIAGNOSIS — H9319 Tinnitus, unspecified ear: Secondary | ICD-10-CM | POA: Insufficient documentation

## 2019-12-18 NOTE — Progress Notes (Signed)
64 y.o. [id number] Married White or Caucasian Not Hispanic or Latino female here for annual exam. Patient states that she has vaginal dryness. She says that she doesn't feel like her estradiol cream helps much. She feels vaginal dry and local irritation a few x a month. Only using the vaginal estrogen 1 x a week. With sex she uses a lubricant, sometimes it's okay, sometimes uncomfortable.   No vaginal bleeding. No bowel or bladder issues.    She has a right inguinal hernia, plans surgery in the fall. Occasionally aches, but overall stable.     No LMP recorded (lmp unknown). Patient is postmenopausal.          Sexually active: Yes.    The current method of family planning is post menopausal status.    Exercising: Yes.    Gym, weights, tennis, golf and gardening.  Smoker:  no  Health Maintenance: Pap:  03/02/2017 WNL History of abnormal Pap:  no MMG: 04/12/19 Density B Bi-rads 1 neg  BMD:   05/11/19 Osteopenic. T score -1.4 Colonoscopy: Done with [facility] early 2018 - normal per patient -- repeat in 5 years TDaP:  03/02/17 Gardasil: NA   reports that she has quit smoking. Her smoking use included cigarettes. She has never used smokeless tobacco. She reports current alcohol use of about 4.0 standard drinks of alcohol per week. She reports that she does not use drugs. Retired, Daughter is an Magazine features editor. Dad had a stroke this year, living independently.   Past Medical History:  Diagnosis Date  . Genital warts     Past Surgical History:  Procedure Laterality Date  . CESAREAN SECTION    . COSMETIC SURGERY    . [place]  . TUBAL LIGATION      Current Outpatient Medications  Medication Sig Dispense Refill  . DOXYCYCLINE HYCLATE PO Take by mouth.    . estradiol (ESTRACE) 0.1 MG/GM vaginal cream Use 1 gram vaginally twice weekly at bedtime 42.5 g 1  . Multiple Vitamin (MULTIVITAMIN) tablet Take 1 tablet by mouth daily.    . Naproxen Sodium 220 MG CAPS Take  by mouth as needed.    . Omega-3 Fatty Acids (OMEGA-3 FISH OIL PO) Take by mouth daily.    Marland Kitchen spironolactone (ALDACTONE) 100 MG tablet Take 1 tablet by mouth daily.     No current facility-administered medications for this visit.  On doxycyline and spironolactone for cystic acne. Started 3 years ago.   Family History  Problem Relation Age of Onset  . Arthritis Father     Review of Systems  Constitutional: Negative.   HENT: Negative.   Eyes: Negative.   Respiratory: Negative.   Cardiovascular: Negative.   Gastrointestinal: Negative.   Endocrine: Negative.   Genitourinary: Negative.   Musculoskeletal: Negative.   Skin: Negative.   Allergic/Immunologic: Negative.   Neurological: Negative.   Hematological: Negative.   Psychiatric/Behavioral: Negative.     Exam:   LMP  (LMP Unknown)   Weight change: @WEIGHTCHANGE @ Height:      Ht Readings from Last 3 Encounters:  11/14/19 5\' 4"  (1.626 m)  03/02/17 5' 3.75" (1.619 m)    General appearance: alert, cooperative and appears stated age Head: Normocephalic, without obvious abnormality, atraumatic Neck: no adenopathy, supple, symmetrical, trachea midline and thyroid normal to inspection and palpation Lungs: clear to auscultation bilaterally Cardiovascular: regular rate and rhythm Breasts: normal appearance, no masses or tenderness Abdomen: soft, non-tender; non distended,  no masses,  no organomegaly Extremities: extremities  normal, atraumatic, no cyanosis or edema Skin: Skin color, texture, turgor normal. No rashes or lesions Lymph nodes: Cervical, supraclavicular, and axillary nodes normal. No abnormal inguinal nodes palpated Neurologic: Grossly normal   Pelvic: External genitalia:  no lesions              Urethra:  normal appearing urethra with no masses, tenderness or lesions              Bartholins and Skenes: normal                 Vagina: atrophic appearing vagina with an increase in watery, yellow vaginal discharge               Cervix: no lesions               Bimanual Exam:  Uterus:  normal size, contour, position, consistency, mobility, non-tender              Adnexa: no mass, fullness, tenderness               Rectovaginal: Confirms               Anus:  normal sphincter tone, no lesions  Terence Lux chaperoned for the exam.  A:  Well Woman with normal exam  Vaginal atrophy  Vaginal irritation, discharge on exam  Mild dyspareunia.   Osteopenia  P:   No pap this year  Screening labs  Discussed breast self exam  Discussed calcium and vit D intake  Mammogram in the fall   Continue vaginal estrogen, increase to 2 x a week  DEXA and colonoscopy  nuswab vaginitis panel

## 2019-12-19 ENCOUNTER — Ambulatory Visit: Payer: BC Managed Care – PPO | Admitting: Obstetrics and Gynecology

## 2019-12-19 ENCOUNTER — Encounter: Payer: Self-pay | Admitting: Obstetrics and Gynecology

## 2019-12-19 VITALS — BP 108/64 | HR 72 | Temp 98.1°F | Ht 63.0 in | Wt 132.0 lb

## 2019-12-19 DIAGNOSIS — Z01419 Encounter for gynecological examination (general) (routine) without abnormal findings: Secondary | ICD-10-CM

## 2019-12-19 DIAGNOSIS — N952 Postmenopausal atrophic vaginitis: Secondary | ICD-10-CM | POA: Diagnosis not present

## 2019-12-19 DIAGNOSIS — Z0000 Encounter for general adult medical examination without abnormal findings: Secondary | ICD-10-CM | POA: Diagnosis not present

## 2019-12-19 DIAGNOSIS — N898 Other specified noninflammatory disorders of vagina: Secondary | ICD-10-CM

## 2019-12-19 DIAGNOSIS — Z8739 Personal history of other diseases of the musculoskeletal system and connective tissue: Secondary | ICD-10-CM

## 2019-12-19 NOTE — Patient Instructions (Signed)
EXERCISE AND DIET:  We recommended that you start or continue a regular exercise program for good health. Regular exercise means any activity that makes your heart beat faster and makes you sweat.  We recommend exercising at least 30 minutes per day at least 3 days a week, preferably 4 or 5.  We also recommend a diet low in fat and sugar.  Inactivity, poor dietary choices and obesity can cause diabetes, heart attack, stroke, and kidney damage, among others.    ALCOHOL AND SMOKING:  Women should limit their alcohol intake to no more than 7 drinks/beers/glasses of wine (combined, not each!) per week. Moderation of alcohol intake to this level decreases your risk of breast cancer and liver damage. And of course, no recreational drugs are part of a healthy lifestyle.  And absolutely no smoking or even second hand smoke. Most people know smoking can cause heart and lung diseases, but did you know it also contributes to weakening of your bones? Aging of your skin?  Yellowing of your teeth and nails?  CALCIUM AND VITAMIN D:  Adequate intake of calcium and Vitamin D are recommended.  The recommendations for exact amounts of these supplements seem to change often, but generally speaking 1,200 mg of calcium (between diet and supplement) and 800 units of Vitamin D per day seems prudent. Certain women may benefit from higher intake of Vitamin D.  If you are among these women, your doctor will have told you during your visit.    PAP SMEARS:  Pap smears, to check for cervical cancer or precancers,  have traditionally been done yearly, although recent scientific advances have shown that most women can have pap smears less often.  However, every woman still should have a physical exam from her gynecologist every year. It will include a breast check, inspection of the vulva and vagina to check for abnormal growths or skin changes, a visual exam of the cervix, and then an exam to evaluate the size and shape of the uterus and  ovaries.  And after 65 years of age, a rectal exam is indicated to check for rectal cancers. We will also provide age appropriate advice regarding health maintenance, like when you should have certain vaccines, screening for sexually transmitted diseases, bone density testing, colonoscopy, mammograms, etc.   MAMMOGRAMS:  All women over 40 years old should have a yearly mammogram. Many facilities now offer a "3D" mammogram, which may cost around $50 extra out of pocket. If possible,  we recommend you accept the option to have the 3D mammogram performed.  It both reduces the number of women who will be called back for extra views which then turn out to be normal, and it is better than the routine mammogram at detecting truly abnormal areas.    COLON CANCER SCREENING: Now recommend starting at age 45. At this time colonoscopy is not covered for routine screening until 50. There are take home tests that can be done between 45-49.   COLONOSCOPY:  Colonoscopy to screen for colon cancer is recommended for all women at age 50.  We know, you hate the idea of the prep.  We agree, BUT, having colon cancer and not knowing it is worse!!  Colon cancer so often starts as a polyp that can be seen and removed at colonscopy, which can quite literally save your life!  And if your first colonoscopy is normal and you have no family history of colon cancer, most women don't have to have it again for   10 years.  Once every ten years, you can do something that may end up saving your life, right?  We will be happy to help you get it scheduled when you are ready.  Be sure to check your insurance coverage so you understand how much it will cost.  It may be covered as a preventative service at no cost, but you should check your particular policy.      Breast Self-Awareness Breast self-awareness means being familiar with how your breasts look and feel. It involves checking your breasts regularly and reporting any changes to your  health care provider. Practicing breast self-awareness is important. A change in your breasts can be a sign of a serious medical problem. Being familiar with how your breasts look and feel allows you to find any problems early, when treatment is more likely to be successful. All women should practice breast self-awareness, including women who have had breast implants. How to do a breast self-exam One way to learn what is normal for your breasts and whether your breasts are changing is to do a breast self-exam. To do a breast self-exam: Look for Changes  1. Remove all the clothing above your waist. 2. Stand in front of a mirror in a room with good lighting. 3. Put your hands on your hips. 4. Push your hands firmly downward. 5. Compare your breasts in the mirror. Look for differences between them (asymmetry), such as: ? Differences in shape. ? Differences in size. ? Puckers, dips, and bumps in one breast and not the other. 6. Look at each breast for changes in your skin, such as: ? Redness. ? Scaly areas. 7. Look for changes in your nipples, such as: ? Discharge. ? Bleeding. ? Dimpling. ? Redness. ? A change in position. Feel for Changes Carefully feel your breasts for lumps and changes. It is best to do this while lying on your back on the floor and again while sitting or standing in the shower or tub with soapy water on your skin. Feel each breast in the following way:  Place the arm on the side of the breast you are examining above your head.  Feel your breast with the other hand.  Start in the nipple area and make  inch (2 cm) overlapping circles to feel your breast. Use the pads of your three middle fingers to do this. Apply light pressure, then medium pressure, then firm pressure. The light pressure will allow you to feel the tissue closest to the skin. The medium pressure will allow you to feel the tissue that is a little deeper. The firm pressure will allow you to feel the tissue  close to the ribs.  Continue the overlapping circles, moving downward over the breast until you feel your ribs below your breast.  Move one finger-width toward the center of the body. Continue to use the  inch (2 cm) overlapping circles to feel your breast as you move slowly up toward your collarbone.  Continue the up and down exam using all three pressures until you reach your armpit.  Write Down What You Find  Write down what is normal for each breast and any changes that you find. Keep a written record with breast changes or normal findings for each breast. By writing this information down, you do not need to depend only on memory for size, tenderness, or location. Write down where you are in your menstrual cycle, if you are still menstruating. If you are having trouble noticing differences   in your breasts, do not get discouraged. With time you will become more familiar with the variations in your breasts and more comfortable with the exam. How often should I examine my breasts? Examine your breasts every month. If you are breastfeeding, the best time to examine your breasts is after a feeding or after using a breast pump. If you menstruate, the best time to examine your breasts is 5-7 days after your period is over. During your period, your breasts are lumpier, and it may be more difficult to notice changes. When should I see my health care provider? See your health care provider if you notice:  A change in shape or size of your breasts or nipples.  A change in the skin of your breast or nipples, such as a reddened or scaly area.  Unusual discharge from your nipples.  A lump or thick area that was not there before.  Pain in your breasts.  Anything that concerns you.  Atrophic Vaginitis  Atrophic vaginitis is a condition in which the tissues that line the vagina become dry and thin. This condition is most common in women who have stopped having regular menstrual periods (are in  menopause). This usually starts when a woman is 79-12 years old. That is the time when a woman's estrogen levels begin to drop (decrease). Estrogen is a female hormone. It helps to keep the tissues of the vagina moist. It stimulates the vagina to produce a clear fluid that lubricates the vagina for sexual intercourse. This fluid also protects the vagina from infection. Lack of estrogen can cause the lining of the vagina to get thinner and dryer. The vagina may also shrink in size. It may become less elastic. Atrophic vaginitis tends to get worse over time as a woman's estrogen level drops. What are the causes? This condition is caused by the normal drop in estrogen that happens around the time of menopause. What increases the risk? Certain conditions or situations may lower a woman's estrogen level, leading to a higher risk for atrophic vaginitis. You are more likely to develop this condition if:  You are taking medicines that block estrogen.  You have had your ovaries removed.  You are being treated for cancer with X-ray (radiation) or medicines (chemotherapy).  You have given birth or are breastfeeding.  You are older than age 20.  You smoke. What are the signs or symptoms? Symptoms of this condition include:  Pain, soreness, or bleeding during sexual intercourse (dyspareunia).  Vaginal burning, irritation, or itching.  Pain or bleeding when a speculum is used in a vaginal exam (pelvic exam).  Having burning pain when passing urine.  Vaginal discharge that is brown or yellow. In some cases, there are no symptoms. How is this diagnosed? This condition is diagnosed by taking a medical history and doing a physical exam. This will include a pelvic exam that checks the vaginal tissues. Though rare, you may also have other tests, including:  A urine test.  A test that checks the acid balance in your vagina (acid balance test). How is this treated? Treatment for this condition  depends on how severe your symptoms are. Treatment may include:  Using an over-the-counter vaginal lubricant before sex.  Using a long-acting vaginal moisturizer.  Using low-dose vaginal estrogen for moderate to severe symptoms that do not respond to other treatments. Options include creams, tablets, and inserts (vaginal rings). Before you use a vaginal estrogen, tell your health care provider if you have a history of: ?  Breast cancer. ? Endometrial cancer. ? Blood clots. If you are not sexually active and your symptoms are very mild, you may not need treatment. Follow these instructions at home: Medicines  Take over-the-counter and prescription medicines only as told by your health care provider. Do not use herbal or alternative medicines unless your health care provider says that you can.  Use over-the-counter creams, lubricants, or moisturizers for dryness only as directed by your health care provider. General instructions  If your atrophic vaginitis is caused by menopause, discuss all of your menopause symptoms and treatment options with your health care provider.  Do not douche.  Do not use products that can make your vagina dry. These include: ? Scented feminine sprays. ? Scented tampons. ? Scented soaps.  Vaginal intercourse can help to improve blood flow and elasticity of vaginal tissue. If it hurts to have sex, try using a lubricant or moisturizer just before having intercourse. Contact a health care provider if:  Your discharge looks different than normal.  Your vagina has an unusual smell.  You have new symptoms.  Your symptoms do not improve with treatment.  Your symptoms get worse. Summary  Atrophic vaginitis is a condition in which the tissues that line the vagina become dry and thin. It is most common in women who have stopped having regular menstrual periods (are in menopause).  Treatment options include using vaginal lubricants and low-dose vaginal  estrogen.  Contact a health care provider if your vagina has an unusual smell, or if your symptoms get worse or do not improve after treatment. This information is not intended to replace advice given to you by your health care provider. Make sure you discuss any questions you have with your health care provider. Document Revised: 06/17/2017 Document Reviewed: 03/31/2017 Elsevier Patient Education  2020 [employer]  

## 2019-12-20 LAB — CBC
Hematocrit: 44.9 % (ref 34.0–46.6)
Hemoglobin: 14.3 g/dL (ref 11.1–15.9)
MCH: 29.9 pg (ref 26.6–33.0)
MCHC: 31.8 g/dL (ref 31.5–35.7)
MCV: 94 fL (ref 79–97)
Platelets: 257 10*3/uL (ref 150–450)
RBC: 4.78 x10E6/uL (ref 3.77–5.28)
RDW: 11.8 % (ref 11.7–15.4)
WBC: 6.8 10*3/uL (ref 3.4–10.8)

## 2019-12-20 LAB — COMPREHENSIVE METABOLIC PANEL
ALT: 15 IU/L (ref 0–32)
AST: 19 IU/L (ref 0–40)
Albumin/Globulin Ratio: 2.3 — ABNORMAL HIGH (ref 1.2–2.2)
Albumin: 4.3 g/dL (ref 3.8–4.8)
Alkaline Phosphatase: 74 IU/L (ref 48–121)
BUN/Creatinine Ratio: 24 (ref 12–28)
BUN: 21 mg/dL (ref 8–27)
Bilirubin Total: 0.4 mg/dL (ref 0.0–1.2)
CO2: 25 mmol/L (ref 20–29)
Calcium: 9.2 mg/dL (ref 8.7–10.3)
Chloride: 102 mmol/L (ref 96–106)
Creatinine, Ser: 0.87 mg/dL (ref 0.57–1.00)
GFR calc Af Amer: 81 mL/min/{1.73_m2} (ref >59)
GFR calc non Af Amer: 71 mL/min/{1.73_m2} (ref >59)
Globulin, Total: 1.9 g/dL (ref 1.5–4.5)
Glucose: 94 mg/dL (ref 65–99)
Potassium: 4.1 mmol/L (ref 3.5–5.2)
Sodium: 143 mmol/L (ref 134–144)
Total Protein: 6.2 g/dL (ref 6.0–8.5)

## 2019-12-20 LAB — LIPID PANEL
Chol/HDL Ratio: 2.6 ratio (ref 0.0–4.4)
Cholesterol, Total: 246 mg/dL — ABNORMAL HIGH (ref 100–199)
HDL: 94 mg/dL (ref >39)
LDL Chol Calc (NIH): 141 mg/dL — ABNORMAL HIGH (ref 0–99)
Triglycerides: 65 mg/dL (ref 0–149)
VLDL Cholesterol Cal: 11 mg/dL (ref 5–40)

## 2019-12-23 LAB — NUSWAB VAGINITIS (VG)
Candida albicans, NAA: NEGATIVE
Candida glabrata, NAA: NEGATIVE
Trich vag by NAA: NEGATIVE

## 2020-01-03 DIAGNOSIS — Z79899 Other long term (current) drug therapy: Secondary | ICD-10-CM | POA: Diagnosis not present

## 2020-01-03 DIAGNOSIS — L700 Acne vulgaris: Secondary | ICD-10-CM | POA: Diagnosis not present

## 2020-02-12 DIAGNOSIS — Z79899 Other long term (current) drug therapy: Secondary | ICD-10-CM | POA: Diagnosis not present

## 2020-02-12 DIAGNOSIS — D225 Melanocytic nevi of trunk: Secondary | ICD-10-CM | POA: Diagnosis not present

## 2020-02-12 DIAGNOSIS — L700 Acne vulgaris: Secondary | ICD-10-CM | POA: Diagnosis not present

## 2020-02-12 DIAGNOSIS — L814 Other melanin hyperpigmentation: Secondary | ICD-10-CM | POA: Diagnosis not present

## 2020-06-18 ENCOUNTER — Other Ambulatory Visit: Payer: Self-pay | Admitting: Orthopaedic Surgery

## 2020-06-18 DIAGNOSIS — M79671 Pain in right foot: Secondary | ICD-10-CM

## 2020-07-04 ENCOUNTER — Other Ambulatory Visit: Payer: Self-pay

## 2020-07-04 ENCOUNTER — Ambulatory Visit
Admission: RE | Admit: 2020-07-04 | Discharge: 2020-07-04 | Disposition: A | Discharge status: Home / Self Care | Payer: Medicare HMO | Source: Ambulatory Visit | Attending: Orthopaedic Surgery | Admitting: Orthopaedic Surgery

## 2020-07-04 DIAGNOSIS — M79671 Pain in right foot: Secondary | ICD-10-CM

## 2020-12-22 ENCOUNTER — Ambulatory Visit: Payer: BC Managed Care – PPO | Admitting: Obstetrics and Gynecology

## 2021-01-09 ENCOUNTER — Ambulatory Visit: Payer: Self-pay | Admitting: Obstetrics and Gynecology

## 2021-01-12 ENCOUNTER — Ambulatory Visit: Payer: Self-pay | Admitting: Nurse Practitioner

## 2021-07-27 ENCOUNTER — Telehealth: Payer: Self-pay

## 2021-07-27 NOTE — Telephone Encounter (Signed)
REFERRAL ONLY SCANNED TO REFERRAL

## 2021-08-13 NOTE — Progress Notes (Signed)
CARDIOLOGY CONSULT NOTE       Patient ID: Jessica Liu MRN: [id number] DOB/AGE: 67/29/1957 67 y.o.  Admit date: (Not on file) Referring Physician: Lendon Ka FNP Primary Physician: Little Ishikawa, MD Primary Cardiologist: New Reason for Consultation: Palpitations  Active Problems:   * No active hospital problems. *   HPI:  66 y.o. referred by [facility] for palpitations. Has moments where she feels her hear flipping. Only last seconds No associated SSCP, dyspnea, syncope She plays golf, pickleball and and tennis regularly with no issues Started a few months ago after her COVID infection Dies have ETOH/  Pulse in office was regular and in 70 s   Lab review LDL 153 HDL 75 Hct 44 normal renal function K 4.1 TSH 1.8   Palpitations since Covid in July daily no long runs or persistence Does have a lot Of tea/coffee throughout the day  Retired Marine scientist. Has one daughter in [place] who is an anesthesiologist   ROS All other systems reviewed and negative except as noted above  Past Medical History:  Diagnosis Date   Breast cancer ([place])    COVID    Genital warts    Hearing loss    Palpitations    Tinnitus     Family History  Problem Relation Age of Onset   Heart disease Mother    Heart attack Father    Arthritis Father    Heart disease Maternal Grandmother    Heart disease Maternal Grandfather    Heart disease Paternal Grandfather    Heart attack Paternal Uncle     Social History   Socioeconomic History   Marital status: Married    Spouse name: Not on file   Number of children: Not on file   Years of education: Not on file   Highest education level: Not on file  Occupational History   Not on file  Tobacco Use   Smoking status: Former    Types: Cigarettes   Smokeless tobacco: Never  Vaping Use   Vaping Use: Never used  Substance and Sexual Activity   Alcohol use: Yes    Alcohol/week: 4.0 standard drinks    Types: 4 Glasses of wine per week   Drug use:  No   Sexual activity: Yes    Birth control/protection: Post-menopausal  Other Topics Concern   Not on file  Social History Narrative   Not on file   Social Determinants of Health   Financial Resource Strain: Not on file  Food Insecurity: Not on file  Transportation Needs: Not on file  Physical Activity: Not on file  Stress: Not on file  Social Connections: Not on file  Intimate Partner Violence: Not on file    Past Surgical History:  Procedure Laterality Date   [place]        Current Outpatient Medications:    estradiol (ESTRACE) 0.1 MG/GM vaginal cream, Use 1 gram vaginally twice weekly at bedtime, Disp: 42.5 g, Rfl: 1   Multiple Vitamin (MULTIVITAMIN) tablet, Take 1 tablet by mouth daily., Disp: , Rfl:    Naproxen Sodium 220 MG CAPS, Take by mouth as needed., Disp: , Rfl:    Omega-3 Fatty Acids (OMEGA-3 FISH OIL PO), Take by mouth daily., Disp: , Rfl:    spironolactone (ALDACTONE) 100 MG tablet, Take 1 tablet by mouth daily., Disp: , Rfl:    valACYclovir (VALTREX) 1000  MG tablet, Take by mouth. (Patient not taking: Reported on 08/17/2021), Disp: , Rfl:     Physical Exam: Blood pressure 110/76, pulse 63, height 5\' 4"  (1.626 m), weight 137 lb (62.1 kg).    Affect appropriate Healthy:  appears stated age 66: normal Neck supple with no adenopathy JVP normal no bruits no thyromegaly Lungs clear with no wheezing and good diaphragmatic motion Heart:  S1/S2 no murmur, no rub, gallop or click PMI normal Abdomen: benighn, BS positve, no tenderness, no AAA no bruit.  No HSM or HJR Distal pulses intact with no bruits No edema Neuro non-focal Skin warm and dry No muscular weakness   Labs:   Lab Results  Component Value Date   WBC 6.8 12/19/2019   HGB 14.3 12/19/2019   HCT 44.9 12/19/2019   MCV 94 12/19/2019   PLT 257 12/19/2019   No results for input(s): NA, K, CL, CO2,  BUN, CREATININE, CALCIUM, PROT, BILITOT, ALKPHOS, ALT, AST, GLUCOSE in the last 168 hours.  Invalid input(s): LABALBU No results found for: CKTOTAL, CKMB, CKMBINDEX, TROPONINI  Lab Results  Component Value Date   CHOL 246 (H) 12/19/2019   CHOL 263 (H) 12/14/2018   Lab Results  Component Value Date   HDL 94 12/19/2019   HDL 98 12/14/2018   Lab Results  Component Value Date   LDLCALC 141 (H) 12/19/2019   LDLCALC 154 (H) 12/14/2018   Lab Results  Component Value Date   TRIG 65 12/19/2019   TRIG 54 12/14/2018   Lab Results  Component Value Date   CHOLHDL 2.6 12/19/2019   CHOLHDL 2.7 12/14/2018   No results found for: LDLDIRECT    Radiology: No results found.  EKG: 08/17/2021 NSR rate 69 normal    ASSESSMENT AND PLAN:   Palpitations:  benign sounding post COVID.  ETT r/o exercise induced arrhythmia, echo to r/o DCM and 14 day monitor to assess but likely only PACls    F/U PRN  Signed: Jenkins Rouge 08/17/2021, 9:23 AM

## 2021-08-17 ENCOUNTER — Encounter: Payer: Self-pay | Admitting: Cardiovascular Disease

## 2021-08-17 ENCOUNTER — Ambulatory Visit: Payer: PPO | Admitting: Cardiovascular Disease

## 2021-08-17 ENCOUNTER — Other Ambulatory Visit: Payer: Self-pay

## 2021-08-17 ENCOUNTER — Ambulatory Visit (INDEPENDENT_AMBULATORY_CARE_PROVIDER_SITE_OTHER): Payer: PPO

## 2021-08-17 VITALS — BP 110/76 | HR 63 | Ht 64.0 in | Wt 137.0 lb

## 2021-08-17 DIAGNOSIS — R002 Palpitations: Secondary | ICD-10-CM | POA: Diagnosis not present

## 2021-08-17 NOTE — Progress Notes (Unsigned)
Enrolled for [employer] to mail a ZIO XT long term holter monitor to the patients address on file.  

## 2021-08-17 NOTE — Patient Instructions (Addendum)
Medication Instructions:   *If you need a refill on your cardiac medications before your next appointment, please call your pharmacy*  Lab Work:  If you have labs (blood work) drawn today and your tests are completely normal, you will receive your results only by: [place] (if you have MyChart) OR A paper copy in the mail If you have any lab test that is abnormal or we need to change your treatment, we will call you to review the results.  Testing/Procedures: Your physician has requested that you have an echocardiogram. Echocardiography is a painless test that uses sound waves to create images of your heart. It provides your doctor with information about the size and shape of your heart and how well your hearts chambers and valves are working. This procedure takes approximately one hour. There are no restrictions for this procedure.  Your physician has recommended that you wear a 14 day monitor. Event monitors are medical devices that record the hearts electrical activity. Doctors most often [place] these monitors to diagnose arrhythmias. Arrhythmias are problems with the speed or rhythm of the heartbeat. The monitor is a small, portable device. You can wear one while you do your normal daily activities. This is usually used to diagnose what is causing palpitations/syncope (passing out).  Your physician has requested that you have an exercise tolerance test. For further information please visit [email or web address]. Please also follow instruction sheet, as given.  ZIO XT- Long Term Monitor Instructions  Your physician has requested you wear a ZIO patch monitor for 14 days.  This is a single patch monitor. [employer] supplies one patch monitor per enrollment. Additional stickers are not available. Please do not apply patch if you will be having a Nuclear Stress Test,  Echocardiogram, Cardiac CT, MRI, or Chest Xray during the period you would be wearing the  monitor. The patch cannot be worn  during these tests. You cannot remove and re-apply the  ZIO XT patch monitor.  Your ZIO patch monitor will be mailed 3 day USPS to your address on file. It may take 3-5 days  to receive your monitor after you have been enrolled.  Once you have received your monitor, please review the enclosed instructions. Your monitor  has already been registered assigning a specific monitor serial # to you.  Billing and Patient Assistance Program Information  We have supplied [employer] with any of your insurance information on file for billing purposes. [employer] offers a sliding scale Patient Assistance Program for patients that do not have  insurance, or whose insurance does not completely cover the cost of the ZIO monitor.  You must apply for the Patient Assistance Program to qualify for this discounted rate.  To apply, please call [employer] at [phone], select option 4, select option 2, ask to apply for  Patient Assistance Program. [facility] will ask your household income, and how many people  are in your household. They will quote your out-of-pocket cost based on that information.  [employer] will also be able to set up a 69-month, interest-free payment plan if needed.  Applying the monitor   Shave hair from upper left chest.  Hold abrader disc by orange tab. Rub abrader in 40 strokes over the upper left chest as  indicated in your monitor instructions.  Clean area with 4 enclosed alcohol pads. Let dry.  Apply patch as indicated in monitor instructions. Patch will be placed under collarbone on left  side of chest with arrow pointing upward.  Rub patch adhesive wings for  2 minutes. Remove white label marked "1". Remove the white  label marked "2". Rub patch adhesive wings for 2 additional minutes.  While looking in a mirror, press and release button in center of patch. A small green light will  flash 3-4 times. This will be your only indicator that the monitor has been turned on.  Do not shower for the  first 24 hours. You may shower after the first 24 hours.  Press the button if you feel a symptom. You will hear a small click. Record Date, Time and  Symptom in the Patient Logbook.  When you are ready to remove the patch, follow instructions on the last 2 pages of Patient  Logbook. Stick patch monitor onto the last page of Patient Logbook.  Place Patient Logbook in the blue and white box. Use locking tab on box and tape box closed  securely. The blue and white box has prepaid postage on it. Please place it in the mailbox as  soon as possible. Your physician should have your test results approximately 7 days after the  monitor has been mailed back to [facility].  Call [place] at [phone] if you have questions regarding  your ZIO XT patch monitor. Call them immediately if you see an orange light blinking on your  monitor.  If your monitor falls off in less than 4 days, contact our Monitor department at [phone].  If your monitor becomes loose or falls off after 4 days call [employer] at [phone] for  suggestions on securing your monitor  Follow-Up: At [facility], you and your health needs are our priority.  As part of our continuing mission to provide you with exceptional heart care, we have created designated Provider Care Teams.  These Care Teams include your primary Cardiologist (physician) and Advanced Practice Providers (APPs -  Physician Assistants and Nurse Practitioners) who all work together to provide you with the care you need, when you need it.  We recommend signing up for the patient portal called "MyChart".  Sign up information is provided on this After Visit Summary.  MyChart is used to connect with patients for Virtual Visits (Telemedicine).  Patients are able to view lab/test results, encounter notes, upcoming appointments, etc.  Non-urgent messages can be sent to your provider as well.   To learn more about what you can do with MyChart, go  to [email or web address].    Your next appointment:   As needed  The format for your next appointment:   In Person  Provider:   Jenkins Rouge, MD {

## 2021-08-19 DIAGNOSIS — R002 Palpitations: Secondary | ICD-10-CM

## 2021-08-25 IMAGING — MR MR FOOT*R* W/O CM
5 series · 40 of 40 positions shown · non-contrast
Comparison: None.

CLINICAL DATA: Right plantar forefoot pain for the past 3-4 months.

EXAM:
MRI OF THE RIGHT FOREFOOT WITHOUT CONTRAST
TECHNIQUE: Multiplanar, multisequence MR imaging of the right forefoot was
performed. No intravenous contrast was administered.

[Series 4: T1 · coronal · right · 3.0mm · 0.31mm/px · 10 of 40 slices shown (1 of 2)]
[im 1/40]
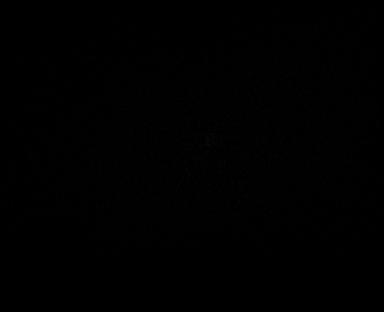
[im 5/40]
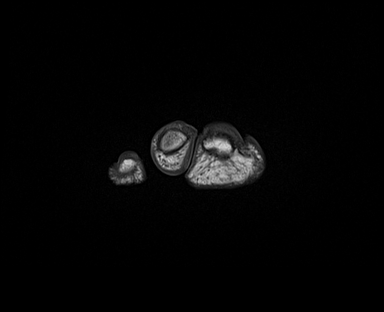
[im 9/40]
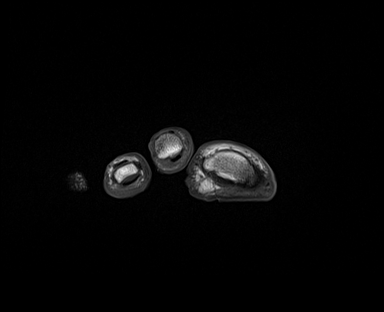
[im 14/40]
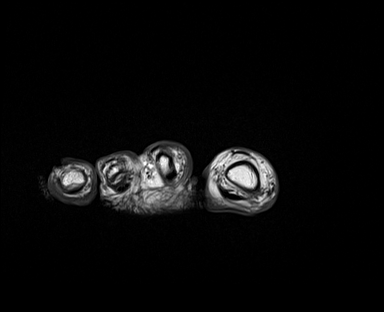
[im 18/40]
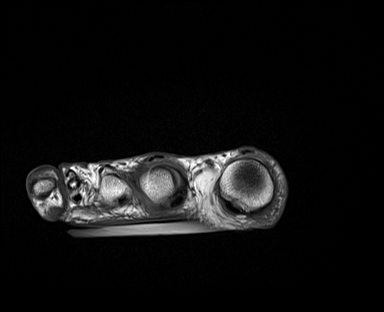
[im 22/40]
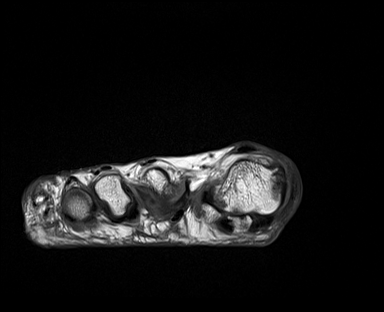
[im 27/40]
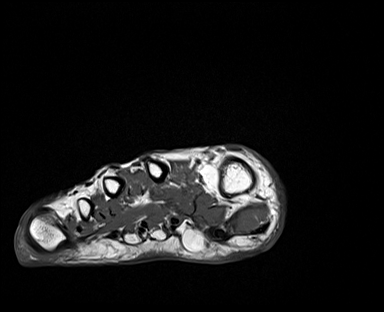
[im 31/40]
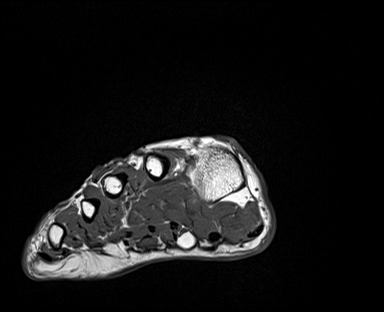
[im 35/40]
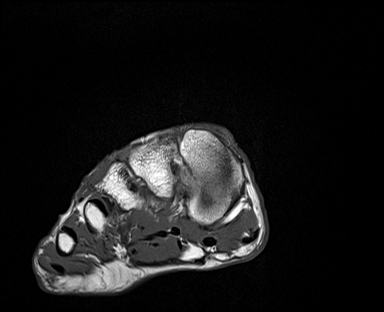
[im 40/40]
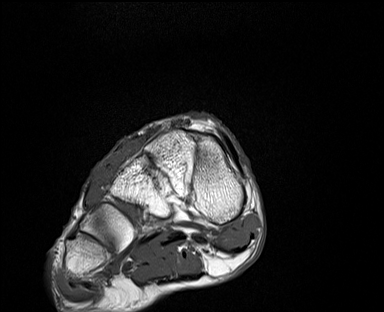

[Series 5: T2 fat-sat · coronal · right · 3.0mm · 0.31mm/px · 11 of 40 slices shown (1 of 2)]
[im 1/40]
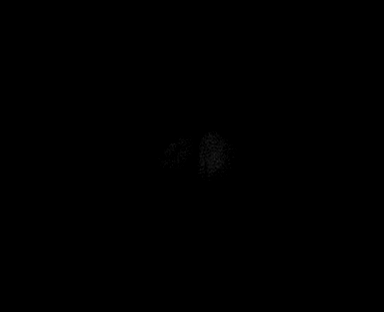
[im 4/40]
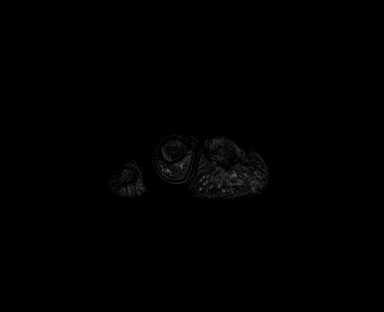
[im 8/40]
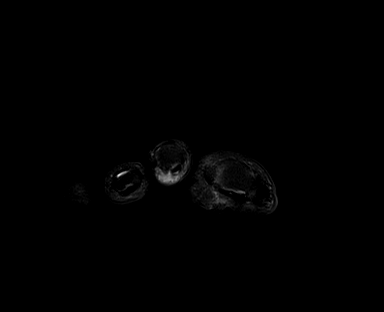
[im 12/40]
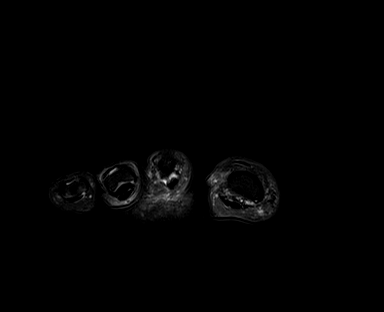
[im 16/40]
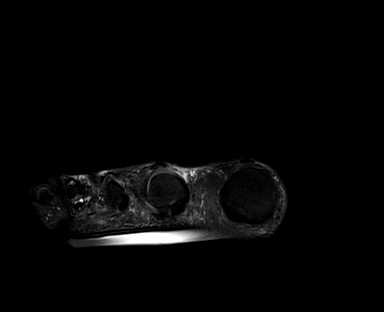
[im 20/40]
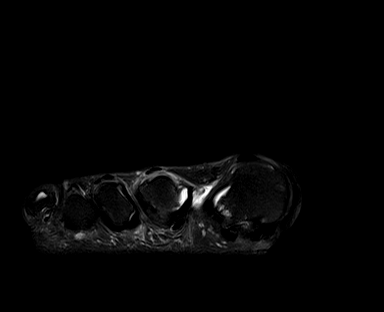
[im 24/40]
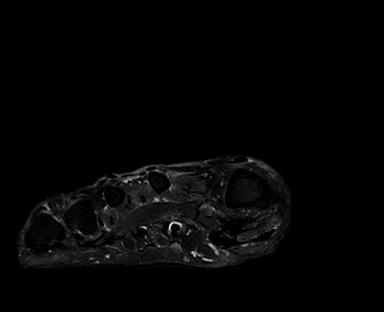
[im 28/40]
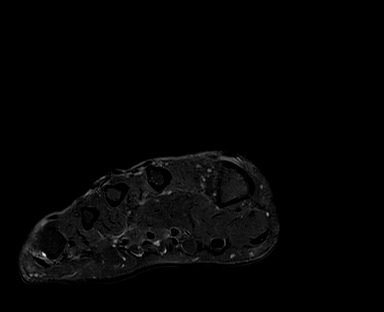
[im 32/40]
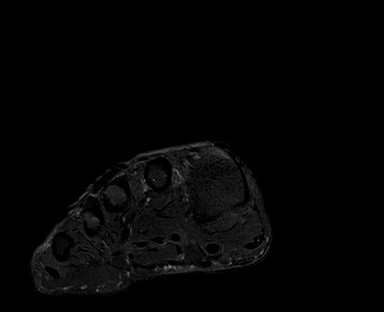
[im 36/40]
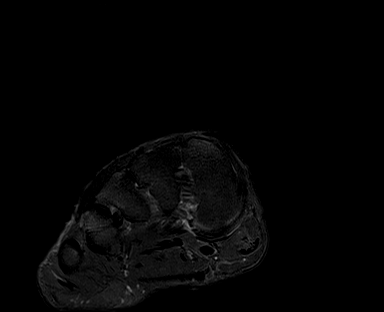
[im 40/40]
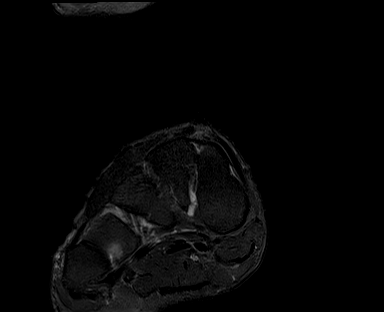

[Series 6: STIR · sagittal · right · 3.0mm · 0.70mm/px · 7 of 26 slices shown]
[im 1/26]
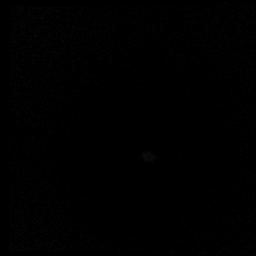
[im 5/26]
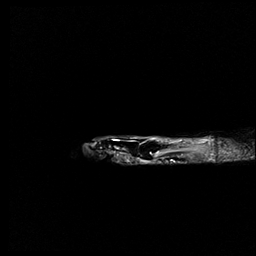
[im 9/26]
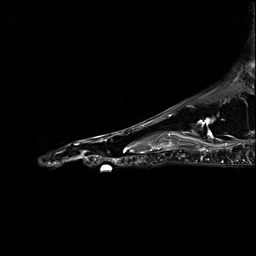
[im 13/26]
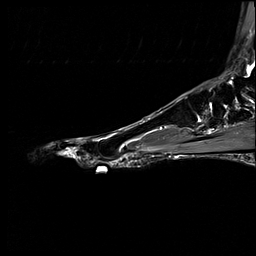
[im 17/26]
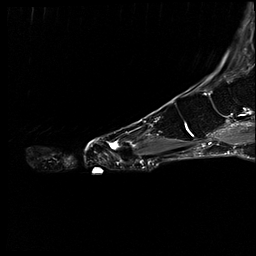
[im 21/26]
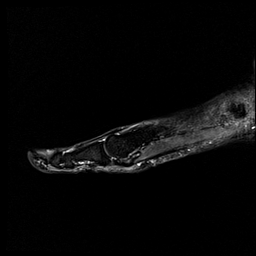
[im 26/26]
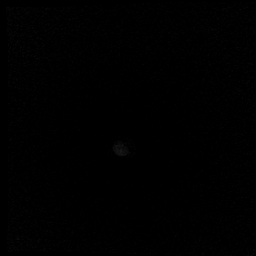

[Series 7: T1 · axial · right · 3.0mm · 0.47mm/px · z∈[-187,-107]mm · 6 of 23 slices shown (2 of 2)]
[im 1/23]
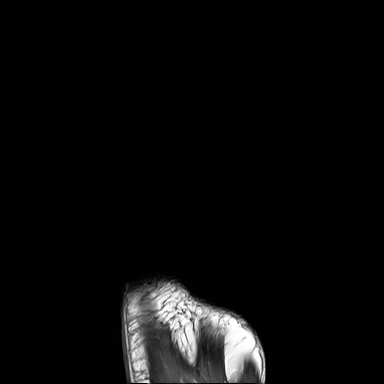
[im 5/23]
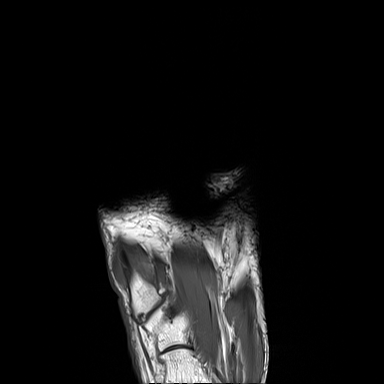
[im 9/23]
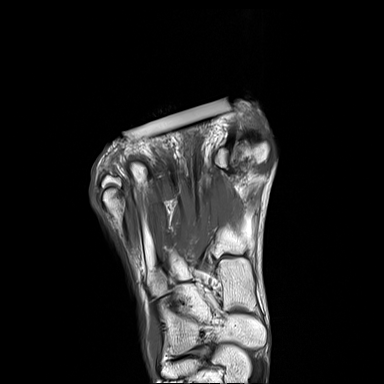
[im 14/23]
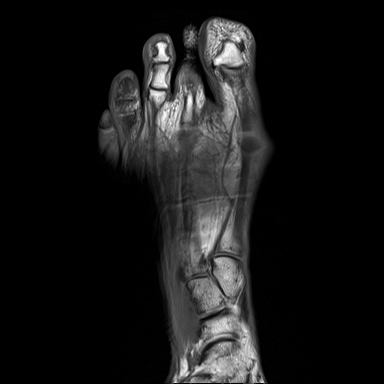
[im 18/23]
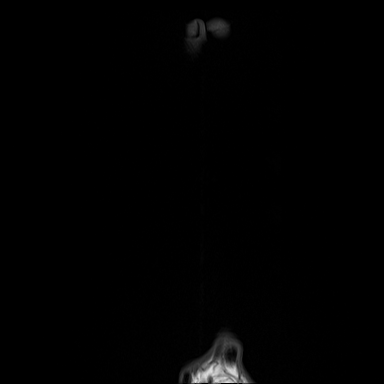
[im 23/23]
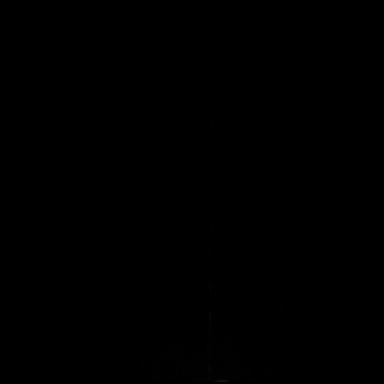

[Series 8: T2 fat-sat · axial · right · 3.0mm · 0.47mm/px · z∈[-187,-107]mm · 6 of 23 slices shown (2 of 2)]
[im 1/23]
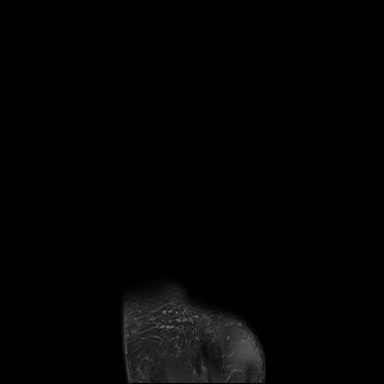
[im 5/23]
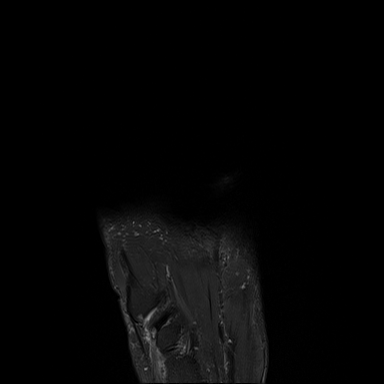
[im 9/23]
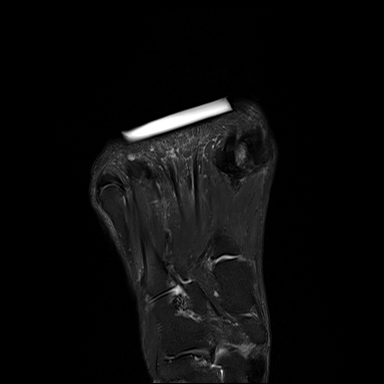
[im 14/23]
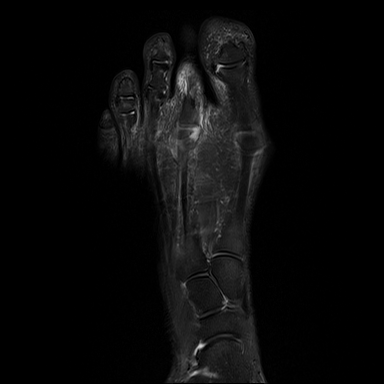
[im 18/23]
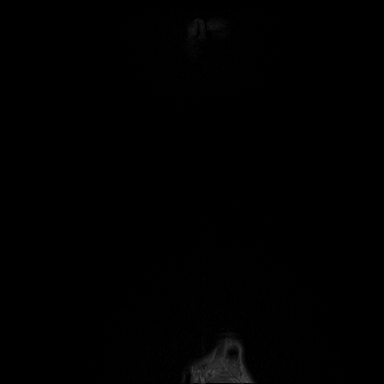
[im 23/23]
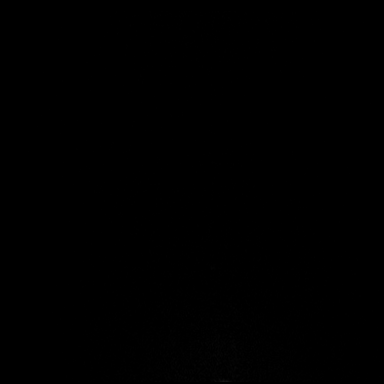

[40 of 40 positions shown; findings below may reference images not displayed]

FINDINGS: Bones/Joint/Cartilage

Chronic juxta-articular erosions of the first metatarsal head. Mild
hallux valgus deformity. First metatarso-sesamoid osteoarthritis
with joint space narrowing and subchondral marrow edema. No fracture
or dislocation. Small second MTP joint effusion with capsular
thickening. Prominent fluid in the first intermetatarsal bursa.

Ligaments

Collateral ligaments are intact.  Lisfranc ligament is intact.

Muscles and Tendons
Flexor and extensor tendons are intact. No muscle edema or atrophy.

Soft tissue
No fluid collection or hematoma.  No soft tissue mass.
IMPRESSION: 1. Second MTP joint capsulitis with small effusion.
2. Chronic juxta-articular erosions of the first metatarsal head,
suspicious for gout.
3. First metatarso-sesamoid osteoarthritis and first intermetatarsal
bursitis.

## 2021-09-08 ENCOUNTER — Ambulatory Visit: Payer: Medicare HMO | Admitting: Family Medicine

## 2021-09-10 ENCOUNTER — Other Ambulatory Visit: Payer: Self-pay

## 2021-09-10 ENCOUNTER — Ambulatory Visit (HOSPITAL_COMMUNITY): Payer: PPO | Attending: Cardiology

## 2021-09-10 ENCOUNTER — Ambulatory Visit (INDEPENDENT_AMBULATORY_CARE_PROVIDER_SITE_OTHER): Payer: PPO

## 2021-09-10 DIAGNOSIS — R002 Palpitations: Secondary | ICD-10-CM | POA: Insufficient documentation

## 2021-09-10 DIAGNOSIS — Z8616 Personal history of COVID-19: Secondary | ICD-10-CM | POA: Insufficient documentation

## 2021-09-10 DIAGNOSIS — Z853 Personal history of malignant neoplasm of breast: Secondary | ICD-10-CM | POA: Insufficient documentation

## 2021-09-10 DIAGNOSIS — C50919 Malignant neoplasm of unspecified site of unspecified female breast: Secondary | ICD-10-CM | POA: Insufficient documentation

## 2021-09-10 LAB — ECHOCARDIOGRAM COMPLETE
Area-P 1/2: 3.16 cm2
S' Lateral: 3 cm

## 2021-09-10 LAB — EXERCISE TOLERANCE TEST
Angina Index: 0
Duke Treadmill Score: 10
Estimated workload: 10.3
Exercise duration (min): 9 min
Exercise duration (sec): 30 s
MPHR: 154 {beats}/min
Peak HR: 133 {beats}/min
Percent HR: 154 %
Rest HR: 68 {beats}/min
ST Depression (mm): 0 mm

## 2021-10-19 ENCOUNTER — Ambulatory Visit: Payer: Self-pay | Admitting: Family Medicine

## 2021-11-16 ENCOUNTER — Ambulatory Visit: Payer: Self-pay | Admitting: Family Medicine

## 2021-12-21 NOTE — Progress Notes (Deleted)
66 y.o. [id number] Married White or Caucasian Not Hispanic or Latino female here for annual exam.      No LMP recorded (lmp unknown). Patient is postmenopausal.          Sexually active: {yes no:314532}  The current method of family planning is {contraception:315051}.    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 T score -1.4 Colonoscopy:  Done with [facility] early 2018 - normal per patient -- repeat in 5 years *** TDaP:  03/02/17  Gardasil: n/a    reports that she has quit smoking. Her smoking use included cigarettes. She has never used smokeless tobacco. She reports current alcohol use of about 4.0 standard drinks per week. She reports that she does not use drugs.  Past Medical History:  Diagnosis Date   Breast cancer ([place])    COVID    Genital warts    Hearing loss    Palpitations    Tinnitus     Past Surgical History:  Procedure Laterality Date   CESAREAN SECTION     COSMETIC SURGERY     TONSILLECTOMY AND ADENOIDECTOMY  1962   TUBAL LIGATION      Current Outpatient Medications  Medication Sig Dispense Refill   estradiol (ESTRACE) 0.1 MG/GM vaginal cream Use 1 gram vaginally twice weekly at bedtime 42.5 g 1   Multiple Vitamin (MULTIVITAMIN) tablet Take 1 tablet by mouth daily.     Naproxen Sodium 220 MG CAPS Take by mouth as needed.     Omega-3 Fatty Acids (OMEGA-3 FISH OIL PO) Take by mouth daily.     spironolactone (ALDACTONE) 100 MG tablet Take 1 tablet by mouth daily.     valACYclovir (VALTREX) 1000 MG tablet Take by mouth. (Patient not taking: Reported on 08/17/2021)     No current facility-administered medications for this visit.    Family History  Problem Relation Age of Onset   Heart disease Mother    Heart attack Father    Arthritis Father    Heart disease Maternal Grandmother    Heart disease  Maternal Grandfather    Heart disease Paternal Grandfather    Heart attack Paternal Uncle     Review of Systems  Exam:   LMP  (LMP Unknown)   Weight change: '@WEIGHTCHANGE'$ @ Height:      Ht Readings from Last 3 Encounters:  08/17/21 '5\' 4"'$  (1.626 m)  12/19/19 '5\' 3"'$  (1.6 m)  11/14/19 '5\' 4"'$  (1.626 m)    General appearance: alert, cooperative and appears stated age Head: Normocephalic, without obvious abnormality, atraumatic Neck: no adenopathy, supple, symmetrical, trachea midline and thyroid {CHL AMB PHY EX THYROID NORM [phone]::"normal to inspection and palpation"} Lungs: clear to auscultation bilaterally Cardiovascular: regular rate and rhythm Breasts: {Exam; breast:13139::"normal appearance, no masses or tenderness"} Abdomen: soft, non-tender; non distended,  no masses,  no organomegaly Extremities: extremities normal, atraumatic, no cyanosis or edema Skin: Skin color, texture, turgor normal. No rashes or lesions Lymph nodes: Cervical, supraclavicular, and axillary nodes normal. No abnormal inguinal nodes palpated Neurologic: Grossly normal   Pelvic: External genitalia:  no lesions              Urethra:  normal appearing urethra with no masses, tenderness or lesions              Bartholins and Skenes: normal  Vagina: normal appearing vagina with normal color and discharge, no lesions              Cervix: {CHL AMB PHY EX CERVIX NORM DEFAULT:[phone]::"no lesions"}               Bimanual Exam:  Uterus:  {CHL AMB PHY EX UTERUS NORM DEFAULT:[phone]::"normal size, contour, position, consistency, mobility, non-tender"}              Adnexa: {CHL AMB PHY EX ADNEXA NO MASS DEFAULT:[phone]::"no mass, fullness, tenderness"}               Rectovaginal: Confirms               Anus:  normal sphincter tone, no lesions  *** chaperoned for the exam.  A:  Well Woman with normal exam  P:

## 2021-12-29 ENCOUNTER — Ambulatory Visit: Payer: Self-pay | Admitting: Obstetrics and Gynecology

## 2021-12-29 NOTE — Progress Notes (Unsigned)
66 y.o. [id number] Married White or Caucasian Not Hispanic or Latino female here for annual exam.      No LMP recorded (lmp unknown). Patient is postmenopausal.          Sexually active: {yes no:314532}  The current method of family planning is {contraception:315051}.    Exercising: {yes no:314532}  {types:19826} Smoker:  {YES [id number]  Health Maintenance: Pap:  03/02/2017 WNL History of abnormal Pap:  no MMG:  04/12/19 density C Bi-rads 1 neg  BMD:   05/11/2019 Osteopenic T-score -1.4  Colonoscopy: Done with [facility] early 2018 - normal per patient -- repeat in 5 years *** TDaP:  03/02/17  Gardasil: n/a   reports that she has quit smoking. Her smoking use included cigarettes. She has never used smokeless tobacco. She reports current alcohol use of about 4.0 standard drinks of alcohol per week. She reports that she does not use drugs.  Past Medical History:  Diagnosis Date   Breast cancer ([place])    COVID    Genital warts    Hearing loss    Palpitations    Tinnitus     Past Surgical History:  Procedure Laterality Date   CESAREAN SECTION     COSMETIC SURGERY     TONSILLECTOMY AND ADENOIDECTOMY  1962   TUBAL LIGATION      Current Outpatient Medications  Medication Sig Dispense Refill   estradiol (ESTRACE) 0.1 MG/GM vaginal cream Use 1 gram vaginally twice weekly at bedtime 42.5 g 1   Multiple Vitamin (MULTIVITAMIN) tablet Take 1 tablet by mouth daily.     Naproxen Sodium 220 MG CAPS Take by mouth as needed.     Omega-3 Fatty Acids (OMEGA-3 FISH OIL PO) Take by mouth daily.     spironolactone (ALDACTONE) 100 MG tablet Take 1 tablet by mouth daily.     valACYclovir (VALTREX) 1000 MG tablet Take by mouth. (Patient not taking: Reported on 08/17/2021)     No current facility-administered medications for this visit.    Family History  Problem Relation Age of Onset   Heart disease Mother    Heart attack Father    Arthritis Father    Heart disease Maternal Grandmother    Heart  disease Maternal Grandfather    Heart disease Paternal Grandfather    Heart attack Paternal Uncle     Review of Systems  Exam:   LMP  (LMP Unknown)   Weight change: '@WEIGHTCHANGE'$ @ Height:      Ht Readings from Last 3 Encounters:  08/17/21 '5\' 4"'$  (1.626 m)  12/19/19 '5\' 3"'$  (1.6 m)  11/14/19 '5\' 4"'$  (1.626 m)    General appearance: alert, cooperative and appears stated age Head: Normocephalic, without obvious abnormality, atraumatic Neck: no adenopathy, supple, symmetrical, trachea midline and thyroid {CHL AMB PHY EX THYROID NORM [phone]::"normal to inspection and palpation"} Lungs: clear to auscultation bilaterally Cardiovascular: regular rate and rhythm Breasts: {Exam; breast:13139::"normal appearance, no masses or tenderness"} Abdomen: soft, non-tender; non distended,  no masses,  no organomegaly Extremities: extremities normal, atraumatic, no cyanosis or edema Skin: Skin color, texture, turgor normal. No rashes or lesions Lymph nodes: Cervical, supraclavicular, and axillary nodes normal. No abnormal inguinal nodes palpated Neurologic: Grossly normal   Pelvic: External genitalia:  no lesions              Urethra:  normal appearing urethra with no masses, tenderness or lesions              Bartholins and Skenes: normal  Vagina: normal appearing vagina with normal color and discharge, no lesions              Cervix: {CHL AMB PHY EX CERVIX NORM DEFAULT:[phone]::"no lesions"}               Bimanual Exam:  Uterus:  {CHL AMB PHY EX UTERUS NORM DEFAULT:[phone]::"normal size, contour, position, consistency, mobility, non-tender"}              Adnexa: {CHL AMB PHY EX ADNEXA NO MASS DEFAULT:[phone]::"no mass, fullness, tenderness"}               Rectovaginal: Confirms               Anus:  normal sphincter tone, no lesions  *** chaperoned for the exam.  A:  Well Woman with normal exam  P:

## 2022-01-12 ENCOUNTER — Other Ambulatory Visit (HOSPITAL_COMMUNITY)
Admission: RE | Admit: 2022-01-12 | Discharge: 2022-01-12 | Disposition: A | Discharge status: Home / Self Care | Payer: PPO | Source: Ambulatory Visit | Attending: Obstetrics and Gynecology | Admitting: Obstetrics and Gynecology

## 2022-01-12 ENCOUNTER — Ambulatory Visit (INDEPENDENT_AMBULATORY_CARE_PROVIDER_SITE_OTHER): Payer: PPO | Admitting: Obstetrics and Gynecology

## 2022-01-12 ENCOUNTER — Encounter: Payer: Self-pay | Admitting: Obstetrics and Gynecology

## 2022-01-12 VITALS — BP 110/70 | HR 63 | Ht 64.0 in | Wt 133.0 lb

## 2022-01-12 DIAGNOSIS — Z8619 Personal history of other infectious and parasitic diseases: Secondary | ICD-10-CM | POA: Diagnosis not present

## 2022-01-12 DIAGNOSIS — Z8739 Personal history of other diseases of the musculoskeletal system and connective tissue: Secondary | ICD-10-CM

## 2022-01-12 DIAGNOSIS — N952 Postmenopausal atrophic vaginitis: Secondary | ICD-10-CM

## 2022-01-12 DIAGNOSIS — E2839 Other primary ovarian failure: Secondary | ICD-10-CM

## 2022-01-12 DIAGNOSIS — Z124 Encounter for screening for malignant neoplasm of cervix: Secondary | ICD-10-CM | POA: Insufficient documentation

## 2022-01-12 DIAGNOSIS — Z9189 Other specified personal risk factors, not elsewhere classified: Secondary | ICD-10-CM

## 2022-01-12 DIAGNOSIS — Z9223 Personal history of estrogen therapy: Secondary | ICD-10-CM

## 2022-01-12 DIAGNOSIS — B009 Herpesviral infection, unspecified: Secondary | ICD-10-CM

## 2022-01-12 MED ORDER — ESTRADIOL 0.1 MG/GM VA CREA: TOPICAL_CREAM | VAGINAL | 1 refills | Status: AC

## 2022-01-12 MED ORDER — VALACYCLOVIR HCL 500 MG PO TABS: ORAL_TABLET | ORAL | 1 refills | Status: AC

## 2022-01-14 LAB — CYTOLOGY - PAP: Diagnosis: NEGATIVE

## 2022-01-18 ENCOUNTER — Other Ambulatory Visit: Payer: Self-pay | Admitting: Obstetrics and Gynecology

## 2022-01-18 DIAGNOSIS — E2839 Other primary ovarian failure: Secondary | ICD-10-CM

## 2022-01-18 DIAGNOSIS — Z1231 Encounter for screening mammogram for malignant neoplasm of breast: Secondary | ICD-10-CM

## 2022-01-18 DIAGNOSIS — Z8739 Personal history of other diseases of the musculoskeletal system and connective tissue: Secondary | ICD-10-CM

## 2022-02-01 ENCOUNTER — Ambulatory Visit: Payer: PPO

## 2022-02-10 ENCOUNTER — Ambulatory Visit
Admission: RE | Admit: 2022-02-10 | Discharge: 2022-02-10 | Disposition: A | Discharge status: Home / Self Care | Payer: PPO | Source: Ambulatory Visit | Attending: Obstetrics and Gynecology | Admitting: Obstetrics and Gynecology

## 2022-02-10 DIAGNOSIS — Z1231 Encounter for screening mammogram for malignant neoplasm of breast: Secondary | ICD-10-CM

## 2022-02-12 ENCOUNTER — Telehealth: Payer: Self-pay | Admitting: *Deleted

## 2022-02-12 DIAGNOSIS — R928 Other abnormal and inconclusive findings on diagnostic imaging of breast: Secondary | ICD-10-CM

## 2022-02-12 NOTE — Telephone Encounter (Signed)
Patient called had screening mammogram done on 02/10/22 results posted to my chart. Results showed possible right breast mass, needs additional imaging. Orders placed patient aware she can call to schedule. Number given to breast center.

## 2022-02-12 NOTE — Telephone Encounter (Signed)
Patient scheduled on 02/17/22 for below imaging.

## 2022-02-17 ENCOUNTER — Ambulatory Visit
Admission: RE | Admit: 2022-02-17 | Discharge: 2022-02-17 | Disposition: A | Discharge status: Home / Self Care | Payer: PPO | Source: Ambulatory Visit | Attending: Obstetrics and Gynecology | Admitting: Obstetrics and Gynecology

## 2022-02-17 ENCOUNTER — Other Ambulatory Visit: Payer: Self-pay | Admitting: Obstetrics and Gynecology

## 2022-02-17 DIAGNOSIS — N6310 Unspecified lump in the right breast, unspecified quadrant: Secondary | ICD-10-CM

## 2022-02-17 DIAGNOSIS — R928 Other abnormal and inconclusive findings on diagnostic imaging of breast: Secondary | ICD-10-CM

## 2022-02-18 ENCOUNTER — Ambulatory Visit
Admission: RE | Admit: 2022-02-18 | Discharge: 2022-02-18 | Disposition: A | Discharge status: Home / Self Care | Payer: PPO | Source: Ambulatory Visit | Attending: Obstetrics and Gynecology | Admitting: Obstetrics and Gynecology

## 2022-02-18 ENCOUNTER — Other Ambulatory Visit: Payer: Self-pay | Admitting: Obstetrics and Gynecology

## 2022-02-18 DIAGNOSIS — N6310 Unspecified lump in the right breast, unspecified quadrant: Secondary | ICD-10-CM

## 2022-07-05 ENCOUNTER — Ambulatory Visit
Admission: RE | Admit: 2022-07-05 | Discharge: 2022-07-05 | Disposition: A | Discharge status: Home / Self Care | Payer: PPO | Source: Ambulatory Visit | Attending: Obstetrics and Gynecology | Admitting: Obstetrics and Gynecology

## 2022-07-05 DIAGNOSIS — E2839 Other primary ovarian failure: Secondary | ICD-10-CM

## 2022-07-05 DIAGNOSIS — Z8739 Personal history of other diseases of the musculoskeletal system and connective tissue: Secondary | ICD-10-CM

## 2022-12-22 ENCOUNTER — Ambulatory Visit: Payer: Self-pay | Admitting: Obstetrics and Gynecology

## 2023-01-31 ENCOUNTER — Ambulatory Visit: Payer: PPO | Admitting: Obstetrics and Gynecology

## 2023-05-25 ENCOUNTER — Other Ambulatory Visit: Payer: Self-pay | Admitting: Obstetrics & Gynecology

## 2023-05-25 DIAGNOSIS — Z1231 Encounter for screening mammogram for malignant neoplasm of breast: Secondary | ICD-10-CM

## 2023-06-15 ENCOUNTER — Ambulatory Visit
Admission: RE | Admit: 2023-06-15 | Discharge: 2023-06-15 | Disposition: A | Discharge status: Home / Self Care | Payer: Medicare HMO | Source: Ambulatory Visit | Attending: Obstetrics & Gynecology | Admitting: Obstetrics & Gynecology

## 2023-06-15 DIAGNOSIS — Z1231 Encounter for screening mammogram for malignant neoplasm of breast: Secondary | ICD-10-CM

## 2024-07-17 ENCOUNTER — Other Ambulatory Visit: Payer: Self-pay | Admitting: Obstetrics & Gynecology

## 2024-07-17 DIAGNOSIS — Z1231 Encounter for screening mammogram for malignant neoplasm of breast: Secondary | ICD-10-CM

## 2024-07-25 ENCOUNTER — Ambulatory Visit

## 2024-08-07 ENCOUNTER — Ambulatory Visit
Admission: RE | Admit: 2024-08-07 | Discharge: 2024-08-07 | Disposition: A | Source: Ambulatory Visit | Attending: Obstetrics & Gynecology | Admitting: Obstetrics & Gynecology

## 2024-08-07 DIAGNOSIS — Z1231 Encounter for screening mammogram for malignant neoplasm of breast: Secondary | ICD-10-CM
